# Patient Record
Sex: Male | Born: 1935 | Race: Black or African American | Hispanic: No | Marital: Married
Health system: Southern US, Community
[De-identification: ages and names within clinical notes are randomized; demographics above are authoritative.]

---

## 2003-11-06 ENCOUNTER — Other Ambulatory Visit: Payer: Self-pay

## 2004-03-15 ENCOUNTER — Ambulatory Visit: Payer: Self-pay

## 2004-04-28 ENCOUNTER — Inpatient Hospital Stay: Payer: Self-pay | Admitting: Unknown Physician Specialty

## 2006-07-15 ENCOUNTER — Other Ambulatory Visit: Payer: Self-pay

## 2006-07-15 ENCOUNTER — Ambulatory Visit: Payer: Self-pay | Admitting: Urology

## 2006-07-22 ENCOUNTER — Ambulatory Visit: Payer: Self-pay | Admitting: Urology

## 2008-04-27 ENCOUNTER — Ambulatory Visit: Payer: Self-pay | Admitting: Internal Medicine

## 2009-03-25 ENCOUNTER — Inpatient Hospital Stay: Payer: Self-pay | Admitting: Internal Medicine

## 2009-07-16 ENCOUNTER — Inpatient Hospital Stay: Payer: Self-pay | Admitting: Internal Medicine

## 2011-04-05 ENCOUNTER — Ambulatory Visit: Payer: Self-pay | Admitting: Internal Medicine

## 2011-04-11 ENCOUNTER — Inpatient Hospital Stay: Payer: Self-pay | Admitting: Student

## 2011-04-11 LAB — COMPREHENSIVE METABOLIC PANEL
Albumin: 3.7 g/dL (ref 3.4–5.0)
Anion Gap: 9 (ref 7–16)
BUN: 21 mg/dL — ABNORMAL HIGH (ref 7–18)
Bilirubin,Total: 1.2 mg/dL — ABNORMAL HIGH (ref 0.2–1.0)
Chloride: 100 mmol/L (ref 98–107)
Creatinine: 1.41 mg/dL — ABNORMAL HIGH (ref 0.60–1.30)
EGFR (African American): >60
EGFR (Non-African Amer.): 51 — ABNORMAL LOW
Osmolality: 281 (ref 275–301)
SGPT (ALT): 262 U/L — ABNORMAL HIGH
Sodium: 136 mmol/L (ref 136–145)

## 2011-04-11 LAB — URINALYSIS, COMPLETE
Blood: NEGATIVE
Ketone: NEGATIVE
Nitrite: NEGATIVE
Ph: 5 (ref 4.5–8.0)
Protein: NEGATIVE
RBC,UR: <1 /HPF (ref 0–5)
Specific Gravity: 1.01 (ref 1.003–1.030)
WBC UR: 3 /HPF (ref 0–5)

## 2011-04-11 LAB — CBC
HGB: 13.6 g/dL (ref 13.0–18.0)
MCH: 30.8 pg (ref 26.0–34.0)
Platelet: 363 10*3/uL (ref 150–440)
RBC: 4.41 10*6/uL (ref 4.40–5.90)
WBC: 13.1 10*3/uL — ABNORMAL HIGH (ref 3.8–10.6)

## 2011-04-11 LAB — LIPASE, BLOOD: Lipase: >3000 U/L (ref 73–393)

## 2011-04-11 LAB — AMYLASE: Amylase: 898 U/L — ABNORMAL HIGH (ref 25–115)

## 2011-04-12 LAB — BASIC METABOLIC PANEL
BUN: 21 mg/dL — ABNORMAL HIGH (ref 7–18)
Calcium, Total: 8.9 mg/dL (ref 8.5–10.1)
Co2: 24 mmol/L (ref 21–32)
EGFR (African American): >60
EGFR (Non-African Amer.): >60
Glucose: 127 mg/dL — ABNORMAL HIGH (ref 65–99)
Osmolality: 284 (ref 275–301)
Potassium: 4.6 mmol/L (ref 3.5–5.1)
Sodium: 140 mmol/L (ref 136–145)

## 2011-04-12 LAB — CBC WITH DIFFERENTIAL/PLATELET
Basophil #: 0.2 10*3/uL — ABNORMAL HIGH (ref 0.0–0.1)
Basophil %: 1 %
Eosinophil #: 0.1 10*3/uL (ref 0.0–0.7)
Lymphocyte %: 13.3 %
MCV: 94 fL (ref 80–100)
Monocyte #: 1.2 10*3/uL — ABNORMAL HIGH (ref 0.0–0.7)
Monocyte %: 7.5 %
Neutrophil %: 77.6 %
Platelet: 313 10*3/uL (ref 150–440)
RBC: 4.17 10*6/uL — ABNORMAL LOW (ref 4.40–5.90)
RDW: 13.1 % (ref 11.5–14.5)
WBC: 16.2 10*3/uL — ABNORMAL HIGH (ref 3.8–10.6)

## 2011-04-12 LAB — MAGNESIUM: Magnesium: 2.5 mg/dL — ABNORMAL HIGH

## 2011-04-12 LAB — HEPATIC FUNCTION PANEL A (ARMC)
Albumin: 3.3 g/dL — ABNORMAL LOW (ref 3.4–5.0)
Alkaline Phosphatase: 169 U/L — ABNORMAL HIGH (ref 50–136)
Bilirubin, Direct: 0.1 mg/dL (ref 0.00–0.20)
Bilirubin,Total: 0.7 mg/dL (ref 0.2–1.0)
SGOT(AST): 77 U/L — ABNORMAL HIGH (ref 15–37)
Total Protein: 7.1 g/dL (ref 6.4–8.2)

## 2011-04-13 LAB — CBC WITH DIFFERENTIAL/PLATELET
Basophil #: 0.1 10*3/uL (ref 0.0–0.1)
Basophil %: 0.4 %
Eosinophil #: 0.2 10*3/uL (ref 0.0–0.7)
HCT: 36 % — ABNORMAL LOW (ref 40.0–52.0)
HGB: 11.6 g/dL — ABNORMAL LOW (ref 13.0–18.0)
Lymphocyte #: 1.6 10*3/uL (ref 1.0–3.6)
Lymphocyte %: 11.6 %
MCHC: 32.3 g/dL (ref 32.0–36.0)
MCV: 95 fL (ref 80–100)
Monocyte #: 1.1 10*3/uL — ABNORMAL HIGH (ref 0.0–0.7)
Neutrophil %: 78.9 %
RDW: 12.9 % (ref 11.5–14.5)

## 2011-04-13 LAB — BASIC METABOLIC PANEL
BUN: 11 mg/dL (ref 7–18)
Calcium, Total: 8.2 mg/dL — ABNORMAL LOW (ref 8.5–10.1)
Chloride: 103 mmol/L (ref 98–107)
Co2: 27 mmol/L (ref 21–32)
Creatinine: 0.97 mg/dL (ref 0.60–1.30)
EGFR (African American): >60
EGFR (Non-African Amer.): >60
Glucose: 213 mg/dL — ABNORMAL HIGH (ref 65–99)
Osmolality: 278 (ref 275–301)
Potassium: 4 mmol/L (ref 3.5–5.1)
Sodium: 136 mmol/L (ref 136–145)

## 2011-04-13 LAB — LIPASE, BLOOD: Lipase: 732 U/L — ABNORMAL HIGH (ref 73–393)

## 2011-04-13 LAB — HEPATIC FUNCTION PANEL A (ARMC)
Albumin: 2.7 g/dL — ABNORMAL LOW (ref 3.4–5.0)
Alkaline Phosphatase: 126 U/L (ref 50–136)
Bilirubin,Total: 0.5 mg/dL (ref 0.2–1.0)
SGOT(AST): 25 U/L (ref 15–37)
SGPT (ALT): 108 U/L — ABNORMAL HIGH
Total Protein: 6.4 g/dL (ref 6.4–8.2)

## 2011-04-14 LAB — HEPATIC FUNCTION PANEL A (ARMC)
Bilirubin, Direct: 0.2 mg/dL (ref 0.00–0.20)
Bilirubin,Total: 0.5 mg/dL (ref 0.2–1.0)

## 2011-04-14 LAB — LIPASE, BLOOD: Lipase: 388 U/L (ref 73–393)

## 2011-04-15 LAB — CBC WITH DIFFERENTIAL/PLATELET
Basophil #: 0.1 10*3/uL (ref 0.0–0.1)
Eosinophil #: 0.3 10*3/uL (ref 0.0–0.7)
Eosinophil %: 4.3 %
HCT: 34 % — ABNORMAL LOW (ref 40.0–52.0)
Lymphocyte #: 1.3 10*3/uL (ref 1.0–3.6)
Lymphocyte %: 17 %
MCV: 94 fL (ref 80–100)
Monocyte #: 0.7 10*3/uL (ref 0.0–0.7)
Monocyte %: 9.1 %
Neutrophil #: 5.3 10*3/uL (ref 1.4–6.5)
RBC: 3.62 10*6/uL — ABNORMAL LOW (ref 4.40–5.90)
RDW: 12.7 % (ref 11.5–14.5)
WBC: 7.7 10*3/uL (ref 3.8–10.6)

## 2011-04-15 LAB — HEPATIC FUNCTION PANEL A (ARMC)
Albumin: 2.7 g/dL — ABNORMAL LOW (ref 3.4–5.0)
Bilirubin, Direct: <0.1 mg/dL (ref 0.00–0.20)
SGOT(AST): 17 U/L (ref 15–37)
SGPT (ALT): 56 U/L

## 2011-04-16 LAB — BASIC METABOLIC PANEL
Calcium, Total: 8.5 mg/dL (ref 8.5–10.1)
Chloride: 104 mmol/L (ref 98–107)
Co2: 27 mmol/L (ref 21–32)
Glucose: 114 mg/dL — ABNORMAL HIGH (ref 65–99)
Osmolality: 272 (ref 275–301)
Potassium: 3.5 mmol/L (ref 3.5–5.1)

## 2011-04-17 LAB — CBC WITH DIFFERENTIAL/PLATELET
Basophil %: 0.2 %
Eosinophil #: 0.3 10*3/uL (ref 0.0–0.7)
Eosinophil %: 3.2 %
HGB: 11 g/dL — ABNORMAL LOW (ref 13.0–18.0)
Lymphocyte %: 25.8 %
MCH: 30.6 pg (ref 26.0–34.0)
MCV: 94 fL (ref 80–100)
Monocyte #: 0.4 10*3/uL (ref 0.0–0.7)
Neutrophil #: 5.5 10*3/uL (ref 1.4–6.5)
Neutrophil %: 66.3 %
Platelet: 351 10*3/uL (ref 150–440)
RBC: 3.59 10*6/uL — ABNORMAL LOW (ref 4.40–5.90)
RDW: 12.6 % (ref 11.5–14.5)

## 2011-04-17 LAB — BASIC METABOLIC PANEL
BUN: 7 mg/dL (ref 7–18)
Chloride: 104 mmol/L (ref 98–107)
Co2: 26 mmol/L (ref 21–32)
Creatinine: 0.8 mg/dL (ref 0.60–1.30)
EGFR (Non-African Amer.): >60
Glucose: 91 mg/dL (ref 65–99)
Osmolality: 277 (ref 275–301)
Potassium: 4.5 mmol/L (ref 3.5–5.1)
Sodium: 140 mmol/L (ref 136–145)

## 2011-04-17 LAB — CULTURE, BLOOD (SINGLE)

## 2011-04-22 LAB — PATHOLOGY REPORT

## 2011-05-03 ENCOUNTER — Ambulatory Visit: Payer: Self-pay | Admitting: Internal Medicine

## 2011-06-17 ENCOUNTER — Ambulatory Visit: Payer: Self-pay | Admitting: Vascular Surgery

## 2011-06-17 LAB — BASIC METABOLIC PANEL
Anion Gap: 9 (ref 7–16)
Calcium, Total: 9.7 mg/dL (ref 8.5–10.1)
Co2: 26 mmol/L (ref 21–32)
EGFR (Non-African Amer.): >60
Glucose: 103 mg/dL — ABNORMAL HIGH (ref 65–99)
Osmolality: 284 (ref 275–301)
Potassium: 5.3 mmol/L — ABNORMAL HIGH (ref 3.5–5.1)
Sodium: 141 mmol/L (ref 136–145)

## 2011-07-08 ENCOUNTER — Emergency Department: Payer: Self-pay | Admitting: Emergency Medicine

## 2011-07-08 LAB — COMPREHENSIVE METABOLIC PANEL
Albumin: 4 g/dL (ref 3.4–5.0)
Bilirubin,Total: 0.4 mg/dL (ref 0.2–1.0)
Calcium, Total: 9.6 mg/dL (ref 8.5–10.1)
Chloride: 102 mmol/L (ref 98–107)
Co2: 27 mmol/L (ref 21–32)
Creatinine: 1.16 mg/dL (ref 0.60–1.30)
EGFR (African American): >60
Glucose: 152 mg/dL — ABNORMAL HIGH (ref 65–99)
Osmolality: 277 (ref 275–301)
SGPT (ALT): 16 U/L

## 2011-07-08 LAB — CBC
HGB: 12.5 g/dL — ABNORMAL LOW (ref 13.0–18.0)
MCH: 29.9 pg (ref 26.0–34.0)
MCV: 93 fL (ref 80–100)
Platelet: 393 10*3/uL (ref 150–440)
RBC: 4.18 10*6/uL — ABNORMAL LOW (ref 4.40–5.90)
RDW: 13.2 % (ref 11.5–14.5)

## 2011-07-08 LAB — TROPONIN I: Troponin-I: <0.02 ng/mL

## 2011-09-16 ENCOUNTER — Ambulatory Visit: Payer: Self-pay | Admitting: Vascular Surgery

## 2011-09-16 LAB — CREATININE, SERUM
Creatinine: 1.11 mg/dL (ref 0.60–1.30)
EGFR (African American): >60
EGFR (Non-African Amer.): >60

## 2011-09-16 LAB — BUN: BUN: 32 mg/dL — ABNORMAL HIGH (ref 7–18)

## 2011-11-18 ENCOUNTER — Ambulatory Visit: Payer: Self-pay | Admitting: Vascular Surgery

## 2011-11-18 LAB — BASIC METABOLIC PANEL
BUN: 35 mg/dL — ABNORMAL HIGH (ref 7–18)
Chloride: 106 mmol/L (ref 98–107)
Creatinine: 1.13 mg/dL (ref 0.60–1.30)
Potassium: 4.8 mmol/L (ref 3.5–5.1)
Sodium: 137 mmol/L (ref 136–145)

## 2011-11-29 ENCOUNTER — Inpatient Hospital Stay: Payer: Self-pay | Admitting: Podiatry

## 2011-11-29 LAB — BASIC METABOLIC PANEL
Anion Gap: 6 — ABNORMAL LOW (ref 7–16)
Calcium, Total: 10 mg/dL (ref 8.5–10.1)
Chloride: 108 mmol/L — ABNORMAL HIGH (ref 98–107)
Co2: 28 mmol/L (ref 21–32)
Creatinine: 1.23 mg/dL (ref 0.60–1.30)
EGFR (Non-African Amer.): 54 — ABNORMAL LOW
Osmolality: 293 (ref 275–301)

## 2011-11-29 LAB — CBC WITH DIFFERENTIAL/PLATELET
Basophil #: 0.1 10*3/uL (ref 0.0–0.1)
Eosinophil #: 0.3 10*3/uL (ref 0.0–0.7)
HCT: 36.6 % — ABNORMAL LOW (ref 40.0–52.0)
Lymphocyte #: 2.8 10*3/uL (ref 1.0–3.6)
Lymphocyte %: 21.3 %
MCHC: 32.1 g/dL (ref 32.0–36.0)
Monocyte #: 1 x10 3/mm (ref 0.2–1.0)
Monocyte %: 7.9 %
Neutrophil #: 8.9 10*3/uL — ABNORMAL HIGH (ref 1.4–6.5)
Platelet: 504 10*3/uL — ABNORMAL HIGH (ref 150–440)
RBC: 4.01 10*6/uL — ABNORMAL LOW (ref 4.40–5.90)
RDW: 12.9 % (ref 11.5–14.5)
WBC: 13.1 10*3/uL — ABNORMAL HIGH (ref 3.8–10.6)

## 2011-12-01 LAB — CBC WITH DIFFERENTIAL/PLATELET
Eosinophil #: 0.2 10*3/uL (ref 0.0–0.7)
Lymphocyte %: 16.1 %
MCH: 30.2 pg (ref 26.0–34.0)
MCHC: 33.8 g/dL (ref 32.0–36.0)
Monocyte #: 0.8 x10 3/mm (ref 0.2–1.0)
Neutrophil %: 72.2 %
Platelet: 458 10*3/uL — ABNORMAL HIGH (ref 150–440)
RBC: 3.46 10*6/uL — ABNORMAL LOW (ref 4.40–5.90)

## 2011-12-03 ENCOUNTER — Ambulatory Visit: Payer: Self-pay | Admitting: Internal Medicine

## 2011-12-03 LAB — CBC WITH DIFFERENTIAL/PLATELET
Basophil #: 0.1 10*3/uL (ref 0.0–0.1)
Eosinophil #: 0.3 10*3/uL (ref 0.0–0.7)
Eosinophil %: 2.2 %
HGB: 10.1 g/dL — ABNORMAL LOW (ref 13.0–18.0)
Lymphocyte %: 26 %
MCH: 29.9 pg (ref 26.0–34.0)
MCV: 90 fL (ref 80–100)
Monocyte #: 1.3 x10 3/mm — ABNORMAL HIGH (ref 0.2–1.0)
Monocyte %: 9.8 %
Platelet: 467 10*3/uL — ABNORMAL HIGH (ref 150–440)
RBC: 3.39 10*6/uL — ABNORMAL LOW (ref 4.40–5.90)
RDW: 12.8 % (ref 11.5–14.5)
WBC: 13.4 10*3/uL — ABNORMAL HIGH (ref 3.8–10.6)

## 2011-12-09 ENCOUNTER — Other Ambulatory Visit: Payer: Self-pay | Admitting: Podiatry

## 2011-12-13 LAB — WOUND CULTURE

## 2011-12-26 ENCOUNTER — Inpatient Hospital Stay: Payer: Self-pay | Admitting: Internal Medicine

## 2011-12-26 LAB — CBC WITH DIFFERENTIAL/PLATELET
Basophil %: 0.5 %
Eosinophil #: 0.3 10*3/uL (ref 0.0–0.7)
Lymphocyte %: 15.3 %
MCH: 30.1 pg (ref 26.0–34.0)
MCHC: 32.4 g/dL (ref 32.0–36.0)
Monocyte #: 0.8 x10 3/mm (ref 0.2–1.0)
Neutrophil #: 11.3 10*3/uL — ABNORMAL HIGH (ref 1.4–6.5)
Neutrophil %: 76.5 %
Platelet: 489 10*3/uL — ABNORMAL HIGH (ref 150–440)
RDW: 14.2 % (ref 11.5–14.5)

## 2011-12-26 LAB — URINALYSIS, COMPLETE
Bacteria: NONE SEEN
Bilirubin,UR: NEGATIVE
Blood: NEGATIVE
Hyaline Cast: 3
Ketone: NEGATIVE
Ph: 5 (ref 4.5–8.0)
Protein: NEGATIVE
Specific Gravity: 1.014 (ref 1.003–1.030)
Squamous Epithelial: NONE SEEN
WBC UR: 1 /HPF (ref 0–5)

## 2011-12-26 LAB — COMPREHENSIVE METABOLIC PANEL
Alkaline Phosphatase: 106 U/L (ref 50–136)
Anion Gap: 10 (ref 7–16)
Bilirubin,Total: 0.4 mg/dL (ref 0.2–1.0)
Chloride: 110 mmol/L — ABNORMAL HIGH (ref 98–107)
Co2: 18 mmol/L — ABNORMAL LOW (ref 21–32)
Creatinine: 2.81 mg/dL — ABNORMAL HIGH (ref 0.60–1.30)
EGFR (African American): 23 — ABNORMAL LOW
EGFR (Non-African Amer.): 20 — ABNORMAL LOW
Osmolality: 298 (ref 275–301)
Potassium: 6.2 mmol/L — ABNORMAL HIGH (ref 3.5–5.1)
SGPT (ALT): 30 U/L (ref 12–78)
Sodium: 138 mmol/L (ref 136–145)

## 2011-12-26 LAB — BASIC METABOLIC PANEL
Anion Gap: 8 (ref 7–16)
BUN: 61 mg/dL — ABNORMAL HIGH (ref 7–18)
Chloride: 110 mmol/L — ABNORMAL HIGH (ref 98–107)
Co2: 19 mmol/L — ABNORMAL LOW (ref 21–32)
Creatinine: 2.21 mg/dL — ABNORMAL HIGH (ref 0.60–1.30)
EGFR (African American): 31 — ABNORMAL LOW
Potassium: 5.9 mmol/L — ABNORMAL HIGH (ref 3.5–5.1)

## 2011-12-26 LAB — POTASSIUM: Potassium: 5.7 mmol/L — ABNORMAL HIGH (ref 3.5–5.1)

## 2011-12-27 LAB — IRON AND TIBC
Iron Bind.Cap.(Total): 234 ug/dL — ABNORMAL LOW (ref 250–450)
Iron: 75 ug/dL (ref 65–175)
Unbound Iron-Bind.Cap.: 159 ug/dL

## 2011-12-27 LAB — FERRITIN: Ferritin (ARMC): 256 ng/mL (ref 8–388)

## 2011-12-27 LAB — CBC WITH DIFFERENTIAL/PLATELET
Basophil #: 0.1 10*3/uL (ref 0.0–0.1)
Basophil %: 0.4 %
Eosinophil #: 0.4 10*3/uL (ref 0.0–0.7)
HCT: 26.4 % — ABNORMAL LOW (ref 40.0–52.0)
Lymphocyte #: 2 10*3/uL (ref 1.0–3.6)
Lymphocyte %: 15.9 %
MCH: 29.6 pg (ref 26.0–34.0)
MCHC: 31.8 g/dL — ABNORMAL LOW (ref 32.0–36.0)
MCV: 93 fL (ref 80–100)
Monocyte #: 0.7 x10 3/mm (ref 0.2–1.0)
Neutrophil #: 9.2 10*3/uL — ABNORMAL HIGH (ref 1.4–6.5)
Platelet: 484 10*3/uL — ABNORMAL HIGH (ref 150–440)
RDW: 14.3 % (ref 11.5–14.5)
WBC: 12.3 10*3/uL — ABNORMAL HIGH (ref 3.8–10.6)

## 2011-12-27 LAB — BASIC METABOLIC PANEL
BUN: 47 mg/dL — ABNORMAL HIGH (ref 7–18)
Creatinine: 1.68 mg/dL — ABNORMAL HIGH (ref 0.60–1.30)

## 2011-12-28 LAB — CBC WITH DIFFERENTIAL/PLATELET
Basophil #: 0 10*3/uL (ref 0.0–0.1)
Basophil %: 0.5 %
Eosinophil #: 0.5 10*3/uL (ref 0.0–0.7)
HCT: 25 % — ABNORMAL LOW (ref 40.0–52.0)
HGB: 8 g/dL — ABNORMAL LOW (ref 13.0–18.0)
Lymphocyte %: 21.3 %
MCHC: 32.1 g/dL (ref 32.0–36.0)
Monocyte %: 7.5 %
Neutrophil #: 5.8 10*3/uL (ref 1.4–6.5)
Neutrophil %: 64.6 %
Platelet: 372 10*3/uL (ref 150–440)
RDW: 14.6 % — ABNORMAL HIGH (ref 11.5–14.5)
WBC: 9 10*3/uL (ref 3.8–10.6)

## 2011-12-28 LAB — COMPREHENSIVE METABOLIC PANEL
Albumin: 2.7 g/dL — ABNORMAL LOW (ref 3.4–5.0)
Alkaline Phosphatase: 96 U/L (ref 50–136)
Anion Gap: 10 (ref 7–16)
BUN: 17 mg/dL (ref 7–18)
Calcium, Total: 7.9 mg/dL — ABNORMAL LOW (ref 8.5–10.1)
Chloride: 116 mmol/L — ABNORMAL HIGH (ref 98–107)
Creatinine: 1.06 mg/dL (ref 0.60–1.30)
EGFR (Non-African Amer.): >60
Glucose: 94 mg/dL (ref 65–99)
Osmolality: 288 (ref 275–301)
Potassium: 3.7 mmol/L (ref 3.5–5.1)
SGOT(AST): 28 U/L (ref 15–37)
Total Protein: 6.5 g/dL (ref 6.4–8.2)

## 2011-12-29 LAB — HEMOGLOBIN: HGB: 8.3 g/dL — ABNORMAL LOW (ref 13.0–18.0)

## 2011-12-29 LAB — WBCS, STOOL

## 2011-12-30 LAB — CBC WITH DIFFERENTIAL/PLATELET
Basophil %: 0.6 %
Eosinophil %: 3.9 %
HCT: 29.4 % — ABNORMAL LOW (ref 40.0–52.0)
Lymphocyte %: 29.5 %
MCH: 30.8 pg (ref 26.0–34.0)
MCV: 93 fL (ref 80–100)
Monocyte %: 8.9 %
Neutrophil %: 57.1 %
Platelet: 388 10*3/uL (ref 150–440)
RDW: 14.5 % (ref 11.5–14.5)
WBC: 8.3 10*3/uL (ref 3.8–10.6)

## 2011-12-30 LAB — PSA: PSA: 0.3 ng/mL (ref 0.0–4.0)

## 2011-12-30 LAB — KAPPA/LAMBDA FREE LIGHT CHAINS (ARMC)

## 2012-01-03 ENCOUNTER — Ambulatory Visit: Payer: Self-pay | Admitting: Internal Medicine

## 2012-01-14 ENCOUNTER — Inpatient Hospital Stay: Payer: Self-pay | Admitting: Vascular Surgery

## 2012-01-14 LAB — BASIC METABOLIC PANEL
Anion Gap: 6 — ABNORMAL LOW (ref 7–16)
BUN: 68 mg/dL — ABNORMAL HIGH (ref 7–18)
Chloride: 110 mmol/L — ABNORMAL HIGH (ref 98–107)
Co2: 22 mmol/L (ref 21–32)
Creatinine: 3.3 mg/dL — ABNORMAL HIGH (ref 0.60–1.30)
EGFR (African American): 19 — ABNORMAL LOW
Glucose: 39 mg/dL — CL (ref 65–99)
Sodium: 138 mmol/L (ref 136–145)

## 2012-01-14 LAB — CBC WITH DIFFERENTIAL/PLATELET
Basophil #: 0.1 10*3/uL (ref 0.0–0.1)
Basophil %: 0.8 %
Eosinophil %: 3.7 %
HCT: 26.6 % — ABNORMAL LOW (ref 40.0–52.0)
HGB: 8.8 g/dL — ABNORMAL LOW (ref 13.0–18.0)
Lymphocyte #: 1.1 10*3/uL (ref 1.0–3.6)
Lymphocyte %: 13.7 %
MCHC: 33 g/dL (ref 32.0–36.0)
MCV: 93 fL (ref 80–100)
Monocyte %: 7.2 %
Neutrophil %: 74.6 %
Platelet: 485 10*3/uL — ABNORMAL HIGH (ref 150–440)
RDW: 15.3 % — ABNORMAL HIGH (ref 11.5–14.5)
WBC: 7.8 10*3/uL (ref 3.8–10.6)

## 2012-01-15 LAB — BASIC METABOLIC PANEL
Calcium, Total: 8.6 mg/dL (ref 8.5–10.1)
Chloride: 107 mmol/L (ref 98–107)
Co2: 22 mmol/L (ref 21–32)
EGFR (Non-African Amer.): 36 — ABNORMAL LOW
Osmolality: 284 (ref 275–301)
Potassium: 6.6 mmol/L (ref 3.5–5.1)
Sodium: 137 mmol/L (ref 136–145)

## 2012-01-15 LAB — CBC WITH DIFFERENTIAL/PLATELET
Basophil #: 0.1 10*3/uL (ref 0.0–0.1)
Eosinophil #: 0.2 10*3/uL (ref 0.0–0.7)
Lymphocyte #: 2.1 10*3/uL (ref 1.0–3.6)
MCH: 30.6 pg (ref 26.0–34.0)
MCHC: 33.1 g/dL (ref 32.0–36.0)
MCV: 92 fL (ref 80–100)
Monocyte #: 0.7 x10 3/mm (ref 0.2–1.0)
Neutrophil %: 57.3 %
Platelet: 483 10*3/uL — ABNORMAL HIGH (ref 150–440)
RDW: 14.7 % — ABNORMAL HIGH (ref 11.5–14.5)
WBC: 7.3 10*3/uL (ref 3.8–10.6)

## 2012-01-15 LAB — POTASSIUM: Potassium: 5.6 mmol/L — ABNORMAL HIGH (ref 3.5–5.1)

## 2012-01-16 LAB — BASIC METABOLIC PANEL
Anion Gap: 8 (ref 7–16)
BUN: 25 mg/dL — ABNORMAL HIGH (ref 7–18)
Chloride: 111 mmol/L — ABNORMAL HIGH (ref 98–107)
Co2: 20 mmol/L — ABNORMAL LOW (ref 21–32)
Glucose: 91 mg/dL (ref 65–99)
Potassium: 5 mmol/L (ref 3.5–5.1)
Sodium: 139 mmol/L (ref 136–145)

## 2012-01-17 LAB — BASIC METABOLIC PANEL
Anion Gap: 7 (ref 7–16)
BUN: 20 mg/dL — ABNORMAL HIGH (ref 7–18)
Chloride: 110 mmol/L — ABNORMAL HIGH (ref 98–107)
Co2: 20 mmol/L — ABNORMAL LOW (ref 21–32)
Glucose: 79 mg/dL (ref 65–99)
Osmolality: 275 (ref 275–301)

## 2012-01-18 LAB — CBC WITH DIFFERENTIAL/PLATELET
Basophil #: 0.1 10*3/uL (ref 0.0–0.1)
Basophil %: 0.4 %
Eosinophil %: 0.3 %
HCT: 24.5 % — ABNORMAL LOW (ref 40.0–52.0)
HGB: 8 g/dL — ABNORMAL LOW (ref 13.0–18.0)
Lymphocyte #: 2.8 10*3/uL (ref 1.0–3.6)
Lymphocyte %: 18.9 %
MCV: 93 fL (ref 80–100)
Monocyte #: 1.5 x10 3/mm — ABNORMAL HIGH (ref 0.2–1.0)
Monocyte %: 10.3 %
Platelet: 419 10*3/uL (ref 150–440)
RBC: 2.64 10*6/uL — ABNORMAL LOW (ref 4.40–5.90)
RDW: 14.6 % — ABNORMAL HIGH (ref 11.5–14.5)
WBC: 15 10*3/uL — ABNORMAL HIGH (ref 3.8–10.6)

## 2012-01-20 LAB — CBC WITH DIFFERENTIAL/PLATELET
Basophil #: 0.1 10*3/uL (ref 0.0–0.1)
Eosinophil #: 0.2 10*3/uL (ref 0.0–0.7)
HCT: 27.6 % — ABNORMAL LOW (ref 40.0–52.0)
HGB: 8.8 g/dL — ABNORMAL LOW (ref 13.0–18.0)
Lymphocyte #: 1.4 10*3/uL (ref 1.0–3.6)
Lymphocyte %: 11.1 %
MCH: 29.4 pg (ref 26.0–34.0)
MCV: 92 fL (ref 80–100)
Monocyte %: 6.5 %
Platelet: 476 10*3/uL — ABNORMAL HIGH (ref 150–440)
RBC: 3 10*6/uL — ABNORMAL LOW (ref 4.40–5.90)
RDW: 14.7 % — ABNORMAL HIGH (ref 11.5–14.5)
WBC: 12.8 10*3/uL — ABNORMAL HIGH (ref 3.8–10.6)

## 2012-01-22 LAB — PATHOLOGY REPORT

## 2012-03-10 ENCOUNTER — Ambulatory Visit: Payer: Self-pay | Admitting: Vascular Surgery

## 2012-03-12 ENCOUNTER — Ambulatory Visit: Payer: Self-pay | Admitting: Vascular Surgery

## 2012-03-16 LAB — WOUND CULTURE

## 2012-05-04 ENCOUNTER — Ambulatory Visit: Payer: Self-pay | Admitting: Vascular Surgery

## 2012-05-04 LAB — BASIC METABOLIC PANEL
Anion Gap: 6 — ABNORMAL LOW (ref 7–16)
Chloride: 104 mmol/L (ref 98–107)
Co2: 27 mmol/L (ref 21–32)
EGFR (African American): >60
EGFR (Non-African Amer.): >60
Glucose: 81 mg/dL (ref 65–99)
Osmolality: 273 (ref 275–301)

## 2012-06-22 ENCOUNTER — Ambulatory Visit: Payer: Self-pay | Admitting: Vascular Surgery

## 2012-06-22 LAB — CBC
HCT: 33.2 % — ABNORMAL LOW (ref 40.0–52.0)
HGB: 10.6 g/dL — ABNORMAL LOW (ref 13.0–18.0)
MCH: 27.6 pg (ref 26.0–34.0)
MCHC: 31.8 g/dL — ABNORMAL LOW (ref 32.0–36.0)
MCV: 87 fL (ref 80–100)
RBC: 3.84 10*6/uL — ABNORMAL LOW (ref 4.40–5.90)
RDW: 13.6 % (ref 11.5–14.5)
WBC: 12.3 10*3/uL — ABNORMAL HIGH (ref 3.8–10.6)

## 2012-06-22 LAB — BASIC METABOLIC PANEL
Calcium, Total: 9.6 mg/dL (ref 8.5–10.1)
Chloride: 104 mmol/L (ref 98–107)
Co2: 30 mmol/L (ref 21–32)
Creatinine: 0.86 mg/dL (ref 0.60–1.30)
EGFR (Non-African Amer.): >60
Osmolality: 278 (ref 275–301)
Sodium: 137 mmol/L (ref 136–145)

## 2012-06-25 ENCOUNTER — Ambulatory Visit: Payer: Self-pay | Admitting: Vascular Surgery

## 2012-07-16 ENCOUNTER — Ambulatory Visit: Payer: Self-pay | Admitting: Vascular Surgery

## 2012-07-20 LAB — PATHOLOGY REPORT

## 2012-07-23 ENCOUNTER — Ambulatory Visit: Payer: Self-pay | Admitting: Vascular Surgery

## 2012-07-23 LAB — BASIC METABOLIC PANEL
Chloride: 104 mmol/L (ref 98–107)
Creatinine: 0.9 mg/dL (ref 0.60–1.30)
EGFR (African American): >60
EGFR (Non-African Amer.): >60
Glucose: 96 mg/dL (ref 65–99)
Sodium: 138 mmol/L (ref 136–145)

## 2012-07-29 ENCOUNTER — Ambulatory Visit: Payer: Self-pay | Admitting: Vascular Surgery

## 2012-07-30 ENCOUNTER — Inpatient Hospital Stay: Payer: Self-pay | Admitting: Internal Medicine

## 2012-07-30 LAB — URINALYSIS, COMPLETE
Bacteria: NONE SEEN
Bilirubin,UR: NEGATIVE
Blood: NEGATIVE
Glucose,UR: 50 mg/dL (ref 0–75)
Hyaline Cast: 3
Nitrite: NEGATIVE
Ph: 5 (ref 4.5–8.0)
Protein: NEGATIVE
RBC,UR: <1 /HPF (ref 0–5)
Specific Gravity: 1.018 (ref 1.003–1.030)
Squamous Epithelial: <1
WBC UR: 1 /HPF (ref 0–5)

## 2012-07-30 LAB — COMPREHENSIVE METABOLIC PANEL
Albumin: 3 g/dL — ABNORMAL LOW (ref 3.4–5.0)
BUN: 14 mg/dL (ref 7–18)
Bilirubin,Total: 0.4 mg/dL (ref 0.2–1.0)
Chloride: 104 mmol/L (ref 98–107)
Co2: 28 mmol/L (ref 21–32)
EGFR (African American): >60
EGFR (Non-African Amer.): >60
Glucose: 155 mg/dL — ABNORMAL HIGH (ref 65–99)
Potassium: 4.4 mmol/L (ref 3.5–5.1)
SGOT(AST): 20 U/L (ref 15–37)
Sodium: 139 mmol/L (ref 136–145)
Total Protein: 8.7 g/dL — ABNORMAL HIGH (ref 6.4–8.2)

## 2012-07-30 LAB — CBC
HCT: 27.7 % — ABNORMAL LOW (ref 40.0–52.0)
MCHC: 31.8 g/dL — ABNORMAL LOW (ref 32.0–36.0)
MCV: 83 fL (ref 80–100)
Platelet: 550 10*3/uL — ABNORMAL HIGH (ref 150–440)
RBC: 3.35 10*6/uL — ABNORMAL LOW (ref 4.40–5.90)
WBC: 17.3 10*3/uL — ABNORMAL HIGH (ref 3.8–10.6)

## 2012-07-31 LAB — CBC WITH DIFFERENTIAL/PLATELET
Basophil %: 0.5 %
Eosinophil #: 0.1 10*3/uL (ref 0.0–0.7)
HCT: 25.8 % — ABNORMAL LOW (ref 40.0–52.0)
Lymphocyte #: 1.8 10*3/uL (ref 1.0–3.6)
MCV: 82 fL (ref 80–100)
Monocyte #: 1.3 x10 3/mm — ABNORMAL HIGH (ref 0.2–1.0)
Neutrophil %: 78.4 %
RBC: 3.17 10*6/uL — ABNORMAL LOW (ref 4.40–5.90)
RDW: 14.9 % — ABNORMAL HIGH (ref 11.5–14.5)
WBC: 15 10*3/uL — ABNORMAL HIGH (ref 3.8–10.6)

## 2012-07-31 LAB — BASIC METABOLIC PANEL
Calcium, Total: 8.8 mg/dL (ref 8.5–10.1)
Chloride: 104 mmol/L (ref 98–107)
Co2: 26 mmol/L (ref 21–32)
EGFR (African American): >60
EGFR (Non-African Amer.): >60
Osmolality: 273 (ref 275–301)

## 2012-07-31 LAB — PATHOLOGY REPORT

## 2012-08-01 LAB — CBC WITH DIFFERENTIAL/PLATELET
Basophil %: 0.5 %
Eosinophil #: 0.3 10*3/uL (ref 0.0–0.7)
HCT: 23.9 % — ABNORMAL LOW (ref 40.0–52.0)
Lymphocyte #: 1.9 10*3/uL (ref 1.0–3.6)
Lymphocyte %: 14.5 %
MCH: 27 pg (ref 26.0–34.0)
MCHC: 32.8 g/dL (ref 32.0–36.0)
MCV: 82 fL (ref 80–100)
Monocyte #: 0.9 x10 3/mm (ref 0.2–1.0)
Neutrophil #: 9.7 10*3/uL — ABNORMAL HIGH (ref 1.4–6.5)
Neutrophil %: 75.9 %
Platelet: 394 10*3/uL (ref 150–440)
RBC: 2.92 10*6/uL — ABNORMAL LOW (ref 4.40–5.90)
RDW: 14.8 % — ABNORMAL HIGH (ref 11.5–14.5)
WBC: 12.8 10*3/uL — ABNORMAL HIGH (ref 3.8–10.6)

## 2012-08-03 LAB — CULTURE, BLOOD (SINGLE)

## 2012-08-04 LAB — CREATININE, SERUM
Creatinine: 0.59 mg/dL — ABNORMAL LOW (ref 0.60–1.30)
EGFR (African American): >60

## 2012-08-04 LAB — CULTURE, BLOOD (SINGLE)

## 2012-08-05 LAB — CBC WITH DIFFERENTIAL/PLATELET
Basophil %: 0.6 %
Eosinophil %: 2.5 %
HGB: 8.3 g/dL — ABNORMAL LOW (ref 13.0–18.0)
Lymphocyte %: 17.5 %
MCH: 26.7 pg (ref 26.0–34.0)
MCV: 83 fL (ref 80–100)
Monocyte #: 0.8 x10 3/mm (ref 0.2–1.0)
Monocyte %: 6.4 %
Neutrophil #: 9.3 10*3/uL — ABNORMAL HIGH (ref 1.4–6.5)
Neutrophil %: 73 %
Platelet: 516 10*3/uL — ABNORMAL HIGH (ref 150–440)

## 2012-08-06 LAB — BASIC METABOLIC PANEL
Anion Gap: 6 — ABNORMAL LOW (ref 7–16)
BUN: 10 mg/dL (ref 7–18)
Calcium, Total: 9.6 mg/dL (ref 8.5–10.1)
Chloride: 103 mmol/L (ref 98–107)
Creatinine: 0.76 mg/dL (ref 0.60–1.30)
EGFR (African American): >60
EGFR (Non-African Amer.): >60
Osmolality: 276 (ref 275–301)
Potassium: 4.3 mmol/L (ref 3.5–5.1)

## 2012-08-06 LAB — CBC WITH DIFFERENTIAL/PLATELET
Basophil %: 1 %
Eosinophil #: 0.4 10*3/uL (ref 0.0–0.7)
Eosinophil %: 3.2 %
HCT: 26 % — ABNORMAL LOW (ref 40.0–52.0)
HGB: 8.4 g/dL — ABNORMAL LOW (ref 13.0–18.0)
Lymphocyte %: 22.1 %
MCHC: 32.5 g/dL (ref 32.0–36.0)
MCV: 82 fL (ref 80–100)
Monocyte #: 0.8 x10 3/mm (ref 0.2–1.0)
Neutrophil #: 8.8 10*3/uL — ABNORMAL HIGH (ref 1.4–6.5)
Platelet: 579 10*3/uL — ABNORMAL HIGH (ref 150–440)
WBC: 13.1 10*3/uL — ABNORMAL HIGH (ref 3.8–10.6)

## 2012-08-07 LAB — CBC WITH DIFFERENTIAL/PLATELET
Basophil %: 0.4 %
Eosinophil %: 2 %
HCT: 29.4 % — ABNORMAL LOW (ref 40.0–52.0)
Lymphocyte %: 14.4 %
MCH: 26.7 pg (ref 26.0–34.0)
MCV: 84 fL (ref 80–100)
Monocyte %: 7.6 %
Neutrophil #: 11.2 10*3/uL — ABNORMAL HIGH (ref 1.4–6.5)
Neutrophil %: 75.6 %
Platelet: 607 10*3/uL — ABNORMAL HIGH (ref 150–440)
RBC: 3.5 10*6/uL — ABNORMAL LOW (ref 4.40–5.90)
RDW: 16.1 % — ABNORMAL HIGH (ref 11.5–14.5)
WBC: 14.8 10*3/uL — ABNORMAL HIGH (ref 3.8–10.6)

## 2012-08-07 LAB — VANCOMYCIN, TROUGH: Vancomycin, Trough: 18 ug/mL (ref 10–20)

## 2012-08-07 LAB — CULTURE, BLOOD (SINGLE)

## 2012-08-08 LAB — BASIC METABOLIC PANEL
BUN: 21 mg/dL — ABNORMAL HIGH (ref 7–18)
Chloride: 103 mmol/L (ref 98–107)
Co2: 25 mmol/L (ref 21–32)
EGFR (African American): >60
EGFR (Non-African Amer.): 55 — ABNORMAL LOW
Sodium: 136 mmol/L (ref 136–145)

## 2012-08-08 LAB — CBC WITH DIFFERENTIAL/PLATELET
Basophil #: 0.1 10*3/uL
Basophil %: 0.7 %
Eosinophil #: 0.5 10*3/uL
Eosinophil %: 3.7 %
HCT: 26.2 % — ABNORMAL LOW
HGB: 8.6 g/dL — ABNORMAL LOW
Lymphocyte %: 23.3 %
Lymphs Abs: 3 10*3/uL
MCH: 27.3 pg
MCHC: 32.7 g/dL
MCV: 84 fL
Monocyte #: 1.3 10*3/uL — ABNORMAL HIGH
Monocyte %: 10.3 %
Neutrophil #: 8.1 10*3/uL — ABNORMAL HIGH
Neutrophil %: 62 %
Platelet: 529 10*3/uL — ABNORMAL HIGH
RBC: 3.13 x10 6/mm 3 — ABNORMAL LOW
RDW: 16.3 % — ABNORMAL HIGH
WBC: 13.1 10*3/uL — ABNORMAL HIGH

## 2012-08-09 LAB — BASIC METABOLIC PANEL
BUN: 20 mg/dL — ABNORMAL HIGH (ref 7–18)
Co2: 29 mmol/L (ref 21–32)
Creatinine: 1.32 mg/dL — ABNORMAL HIGH (ref 0.60–1.30)
Osmolality: 280 (ref 275–301)
Potassium: 4.6 mmol/L (ref 3.5–5.1)
Sodium: 137 mmol/L (ref 136–145)

## 2012-08-09 LAB — CBC WITH DIFFERENTIAL/PLATELET
Basophil %: 0.8 %
Eosinophil #: 0.4 10*3/uL (ref 0.0–0.7)
Eosinophil %: 3.8 %
HGB: 8.1 g/dL — ABNORMAL LOW (ref 13.0–18.0)
Lymphocyte #: 2.7 10*3/uL (ref 1.0–3.6)
MCHC: 31.8 g/dL — ABNORMAL LOW (ref 32.0–36.0)
Monocyte #: 0.9 x10 3/mm (ref 0.2–1.0)
Monocyte %: 8.1 %
Neutrophil #: 7.4 10*3/uL — ABNORMAL HIGH (ref 1.4–6.5)
Neutrophil %: 64.1 %
Platelet: 552 10*3/uL — ABNORMAL HIGH (ref 150–440)
RBC: 3.05 10*6/uL — ABNORMAL LOW (ref 4.40–5.90)

## 2012-08-10 LAB — CREATININE, SERUM
EGFR (African American): 48 — ABNORMAL LOW
EGFR (Non-African Amer.): 41 — ABNORMAL LOW

## 2012-08-11 LAB — CREATININE, SERUM: EGFR (African American): 49 — ABNORMAL LOW

## 2012-08-11 LAB — VANCOMYCIN, TROUGH: Vancomycin, Trough: 34 ug/mL (ref 10–20)

## 2012-08-15 ENCOUNTER — Other Ambulatory Visit: Payer: Self-pay | Admitting: Specialist

## 2012-08-15 LAB — COMPREHENSIVE METABOLIC PANEL
Albumin: 2.9 g/dL — ABNORMAL LOW (ref 3.4–5.0)
Alkaline Phosphatase: 133 U/L (ref 50–136)
Anion Gap: 5 — ABNORMAL LOW (ref 7–16)
BUN: 31 mg/dL — ABNORMAL HIGH (ref 7–18)
Bilirubin,Total: 0.4 mg/dL (ref 0.2–1.0)
Calcium, Total: 9.3 mg/dL (ref 8.5–10.1)
Co2: 32 mmol/L (ref 21–32)
EGFR (African American): 46 — ABNORMAL LOW
EGFR (Non-African Amer.): 39 — ABNORMAL LOW
Glucose: 78 mg/dL (ref 65–99)
Potassium: 4.7 mmol/L (ref 3.5–5.1)
SGPT (ALT): 21 U/L (ref 12–78)
Sodium: 142 mmol/L (ref 136–145)

## 2012-08-15 LAB — CBC WITH DIFFERENTIAL/PLATELET
Basophil %: 0.2 %
HGB: 7.8 g/dL — ABNORMAL LOW (ref 13.0–18.0)
Lymphocyte #: 2.4 10*3/uL (ref 1.0–3.6)
Lymphocyte %: 24.4 %
MCV: 85 fL (ref 80–100)
Monocyte #: 0.6 x10 3/mm (ref 0.2–1.0)
Monocyte %: 6.3 %
Neutrophil #: 6.1 10*3/uL (ref 1.4–6.5)
Platelet: 476 10*3/uL — ABNORMAL HIGH (ref 150–440)
RBC: 2.89 10*6/uL — ABNORMAL LOW (ref 4.40–5.90)
RDW: 17.4 % — ABNORMAL HIGH (ref 11.5–14.5)
WBC: 9.7 10*3/uL (ref 3.8–10.6)

## 2013-01-17 IMAGING — CR DG ABDOMEN 1V
1 series · 2 of 2 positions shown · non-contrast
Comparison: none

REASON FOR EXAM: follow up of ileus
COMMENTS:

[Series 1: ap · 0.17mm/px · 2 of 2 slices shown]
[im 1/2]
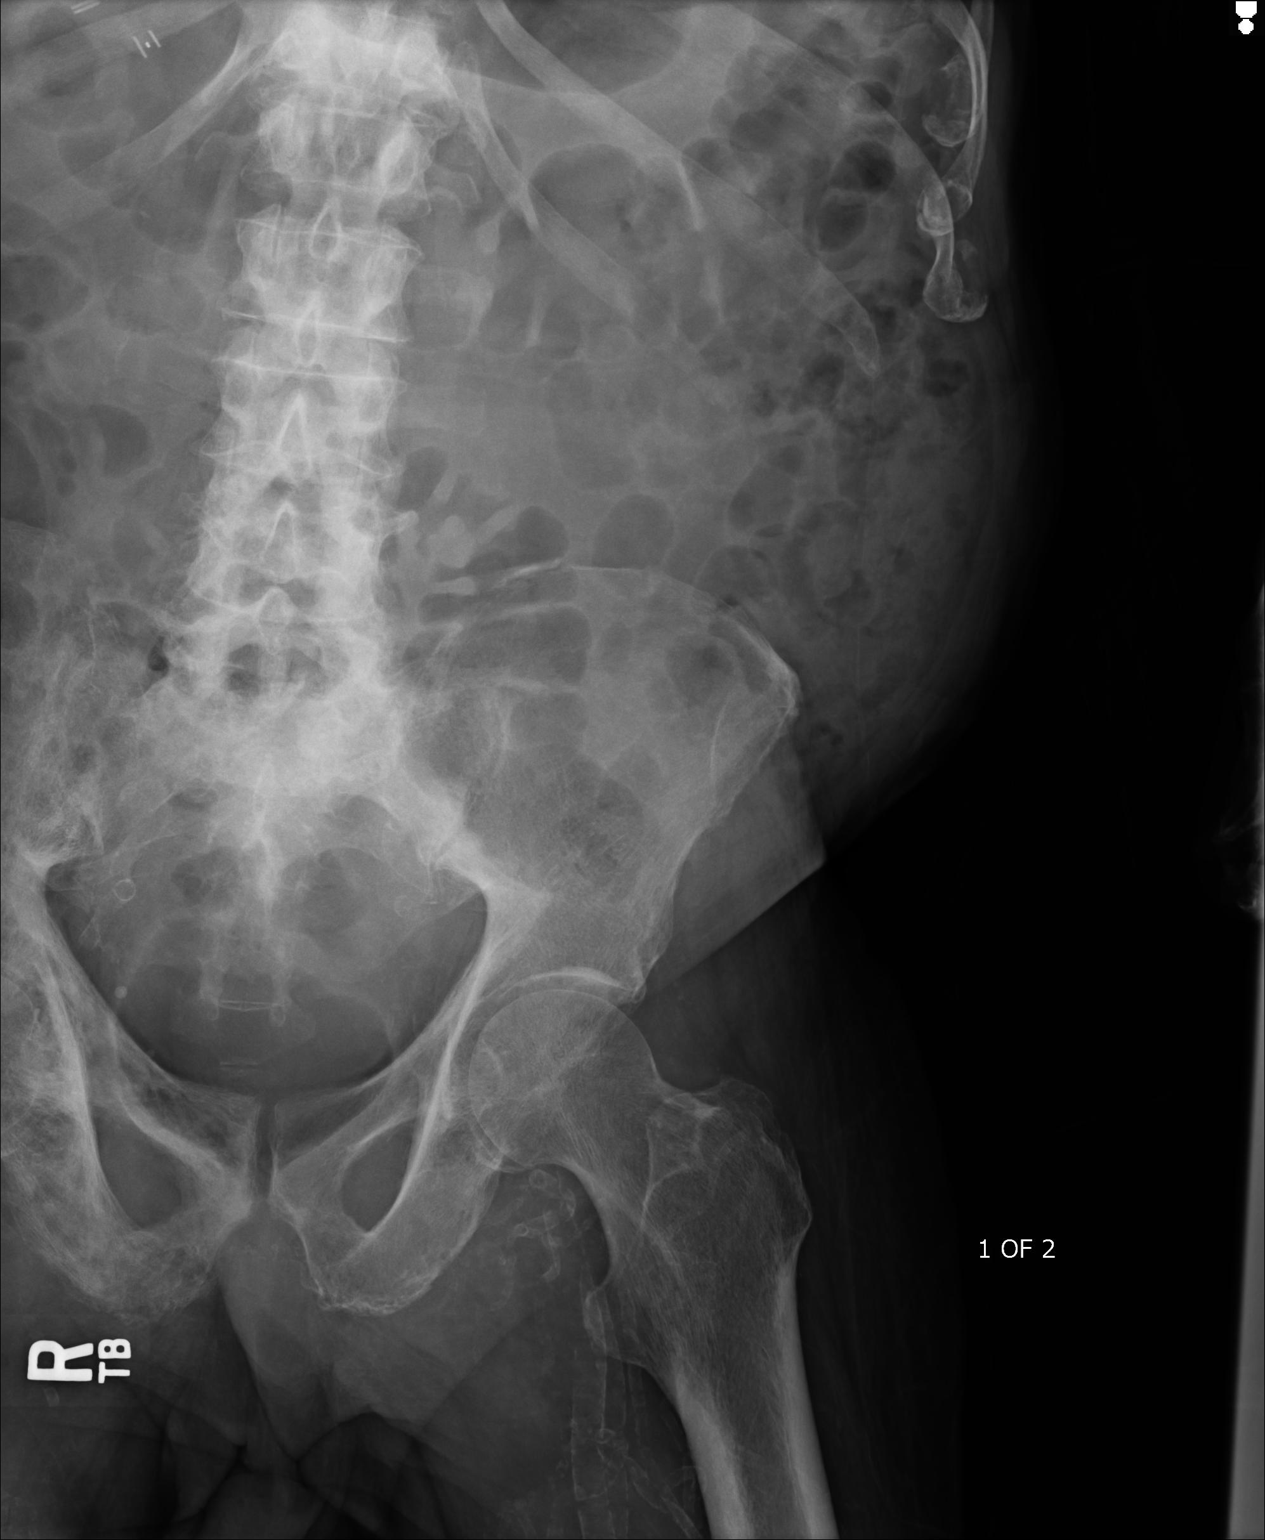
[im 2/2]
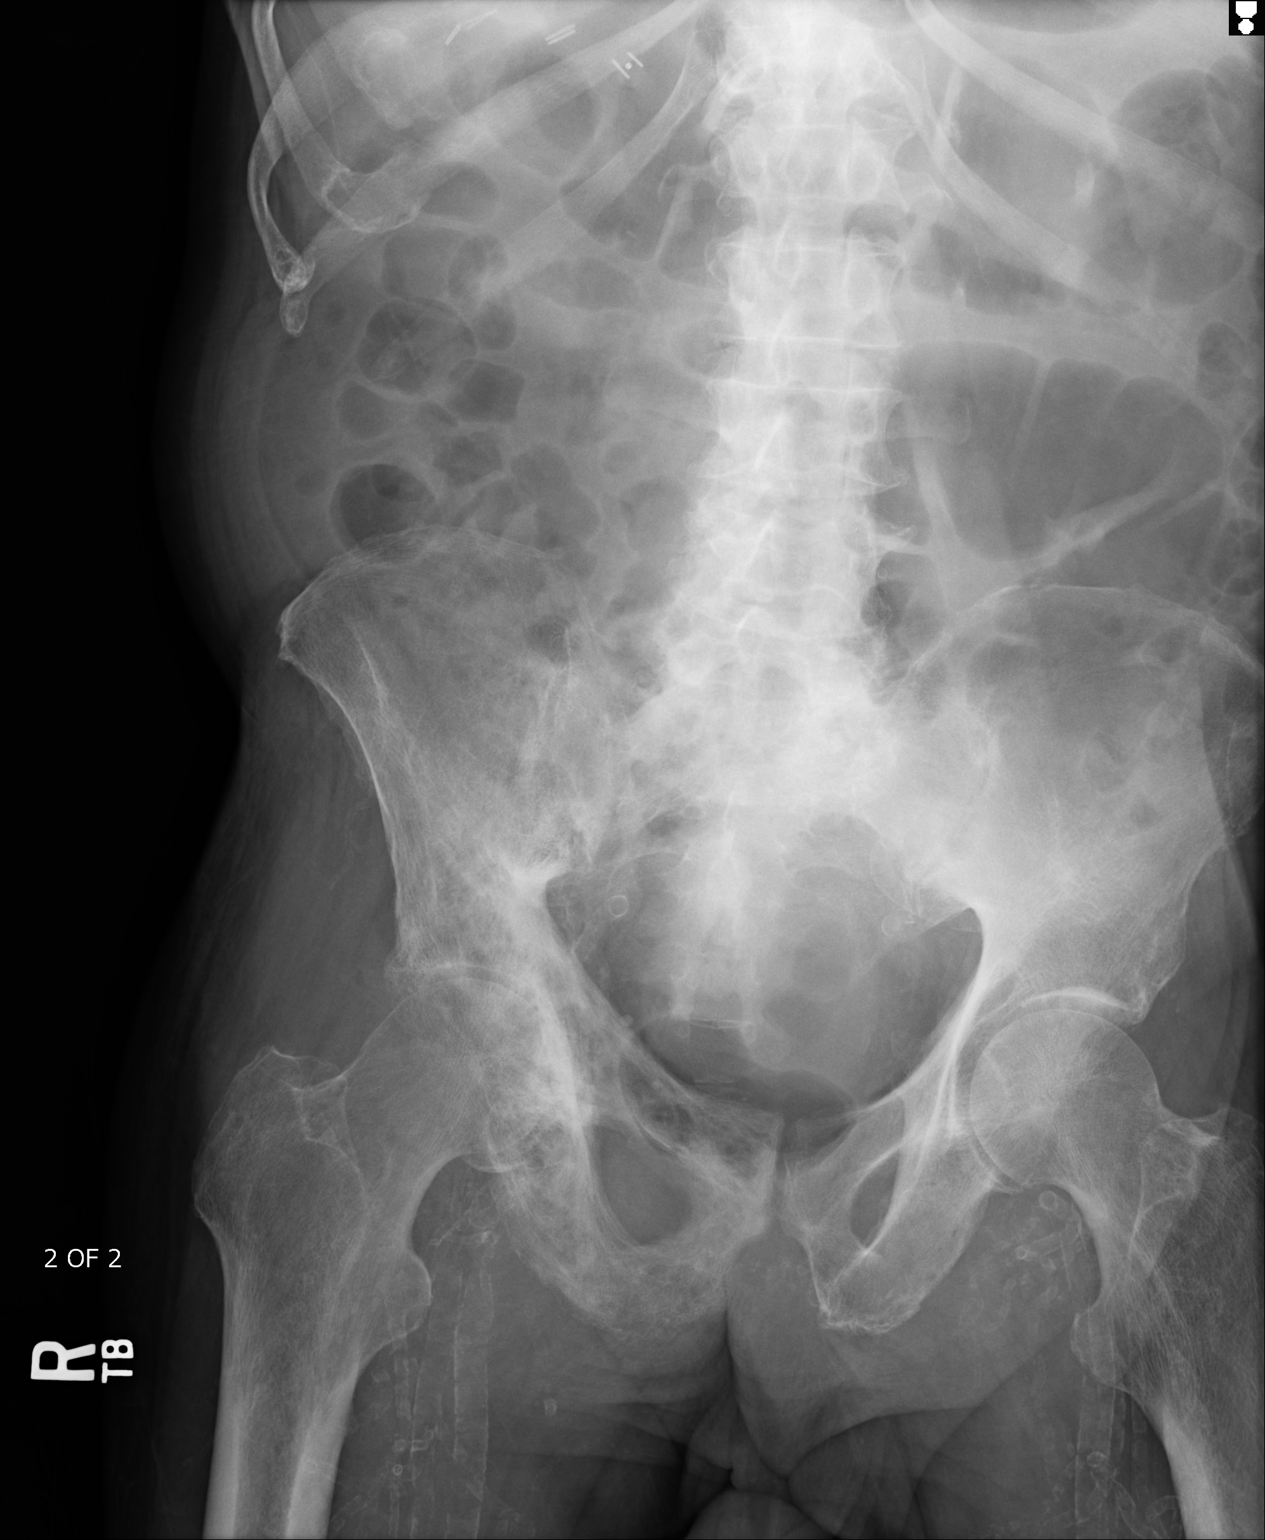

[2 of 2 positions shown; findings below may reference images not displayed]

PROCEDURE:     DXR - DXR ABDOMEN AP ONLY  - April 20, 2011  [DATE]

RESULT:     There is an abnormal appearance of the bony structures of the
right hip consistent with severe degenerative change and sclerosis. The
possibility of underlying sclerotic malignant disease or Paget's disease
should be considered. There is an abnormal appearance within the sacrum,
sacroiliac joints and lumbar spine with similar differential considerations.
Air and fecal material is scattered through the colon to the rectum. There
is air within loops of small bowel without abnormal distention. No findings
are suggestive of obstruction. Mild ileus is not excluded.
IMPRESSION: 1. Abnormal appearance within the bony structures in the right hip and
pelvis and to a lesser extent in the lumbar spine. Is there history of
Paget's disease or other benign process or of known malignant disease?
2. Prominent atherosclerotic calcification.
3. No definite bowel obstruction or perforation. Mild ileus is not excluded.

## 2013-11-07 ENCOUNTER — Inpatient Hospital Stay: Payer: Self-pay | Admitting: Internal Medicine

## 2013-11-07 LAB — DIFFERENTIAL
Bands: 5 %
LYMPHS PCT: 5 %
MONOS PCT: 7 %
Metamyelocyte: 1 %
Myelocyte: 2 %
Segmented Neutrophils: 80 %

## 2013-11-07 LAB — CBC
HCT: 41.5 % (ref 40.0–52.0)
HGB: 13.2 g/dL (ref 13.0–18.0)
MCH: 28.1 pg (ref 26.0–34.0)
MCHC: 31.9 g/dL — ABNORMAL LOW (ref 32.0–36.0)
MCV: 88 fL (ref 80–100)
Platelet: 463 10*3/uL — ABNORMAL HIGH (ref 150–440)
RBC: 4.72 10*6/uL (ref 4.40–5.90)
RDW: 15.1 % — AB (ref 11.5–14.5)
WBC: 41.7 10*3/uL — ABNORMAL HIGH (ref 3.8–10.6)

## 2013-11-07 LAB — COMPREHENSIVE METABOLIC PANEL
ALT: 16 U/L
ANION GAP: 13 (ref 7–16)
AST: 20 U/L (ref 15–37)
Albumin: 2.3 g/dL — ABNORMAL LOW (ref 3.4–5.0)
Alkaline Phosphatase: 121 U/L — ABNORMAL HIGH
BUN: 50 mg/dL — AB (ref 7–18)
Bilirubin,Total: 0.7 mg/dL (ref 0.2–1.0)
CHLORIDE: 100 mmol/L (ref 98–107)
Calcium, Total: 8 mg/dL — ABNORMAL LOW (ref 8.5–10.1)
Co2: 18 mmol/L — ABNORMAL LOW (ref 21–32)
Creatinine: 2.93 mg/dL — ABNORMAL HIGH (ref 0.60–1.30)
EGFR (African American): 21 — ABNORMAL LOW
GFR CALC NON AF AMER: 18 — AB
Glucose: 140 mg/dL — ABNORMAL HIGH (ref 65–99)
Osmolality: 278 (ref 275–301)
POTASSIUM: 4 mmol/L (ref 3.5–5.1)
SODIUM: 131 mmol/L — AB (ref 136–145)
Total Protein: 6.8 g/dL (ref 6.4–8.2)

## 2013-11-07 LAB — URINALYSIS, COMPLETE
BILIRUBIN, UR: NEGATIVE
Bacteria: NONE SEEN
Blood: NEGATIVE
GLUCOSE, UR: NEGATIVE mg/dL (ref 0–75)
Ketone: NEGATIVE
LEUKOCYTE ESTERASE: NEGATIVE
Nitrite: NEGATIVE
Ph: 5 (ref 4.5–8.0)
Protein: NEGATIVE
SQUAMOUS EPITHELIAL: NONE SEEN
Specific Gravity: 1.015 (ref 1.003–1.030)

## 2013-11-07 LAB — PHOSPHORUS: Phosphorus: 4.4 mg/dL (ref 2.5–4.9)

## 2013-11-07 LAB — PROTIME-INR
INR: 1.2
Prothrombin Time: 15.1 secs — ABNORMAL HIGH (ref 11.5–14.7)

## 2013-11-07 LAB — MAGNESIUM: Magnesium: 2 mg/dL

## 2013-11-07 LAB — TROPONIN I: Troponin-I: <0.02 ng/mL

## 2013-11-07 LAB — LIPASE, BLOOD: LIPASE: 57 U/L — AB (ref 73–393)

## 2013-11-07 LAB — CLOSTRIDIUM DIFFICILE(ARMC)

## 2013-11-08 LAB — CBC WITH DIFFERENTIAL/PLATELET
Bands: 2 %
Eosinophil: 1 %
HCT: 39.2 % — AB (ref 40.0–52.0)
HGB: 12.1 g/dL — AB (ref 13.0–18.0)
LYMPHS PCT: 4 %
MCH: 27.1 pg (ref 26.0–34.0)
MCHC: 30.9 g/dL — ABNORMAL LOW (ref 32.0–36.0)
MCV: 88 fL (ref 80–100)
MONOS PCT: 8 %
Metamyelocyte: 4 %
Myelocyte: 2 %
PLATELETS: 507 10*3/uL — AB (ref 150–440)
RBC: 4.48 10*6/uL (ref 4.40–5.90)
RDW: 15.2 % — AB (ref 11.5–14.5)
Segmented Neutrophils: 79 %
WBC: 46.6 10*3/uL — ABNORMAL HIGH (ref 3.8–10.6)

## 2013-11-08 LAB — BASIC METABOLIC PANEL
Anion Gap: 15 (ref 7–16)
BUN: 50 mg/dL — AB (ref 7–18)
CALCIUM: 7.3 mg/dL — AB (ref 8.5–10.1)
CO2: 14 mmol/L — AB (ref 21–32)
Chloride: 107 mmol/L (ref 98–107)
Creatinine: 2.59 mg/dL — ABNORMAL HIGH (ref 0.60–1.30)
EGFR (African American): 25 — ABNORMAL LOW
GFR CALC NON AF AMER: 21 — AB
GLUCOSE: 132 mg/dL — AB (ref 65–99)
Osmolality: 287 (ref 275–301)
Potassium: 3.6 mmol/L (ref 3.5–5.1)
Sodium: 136 mmol/L (ref 136–145)

## 2013-11-08 LAB — TSH: THYROID STIMULATING HORM: 9.51 u[IU]/mL — AB

## 2013-11-08 LAB — URINE CULTURE

## 2013-11-09 LAB — CBC WITH DIFFERENTIAL/PLATELET
BANDS NEUTROPHIL: 16 %
HCT: 35.4 % — ABNORMAL LOW (ref 40.0–52.0)
HGB: 11 g/dL — AB (ref 13.0–18.0)
Lymphocytes: 9 %
MCH: 27.5 pg (ref 26.0–34.0)
MCHC: 31.1 g/dL — ABNORMAL LOW (ref 32.0–36.0)
MCV: 88 fL (ref 80–100)
METAMYELOCYTE: 2 %
Monocytes: 5 %
Myelocyte: 1 %
Platelet: 523 10*3/uL — ABNORMAL HIGH (ref 150–440)
RBC: 4.01 10*6/uL — AB (ref 4.40–5.90)
RDW: 15.2 % — ABNORMAL HIGH (ref 11.5–14.5)
SEGMENTED NEUTROPHILS: 67 %
WBC: 50.6 10*3/uL — AB (ref 3.8–10.6)

## 2013-11-09 LAB — BASIC METABOLIC PANEL
Anion Gap: 10 (ref 7–16)
BUN: 56 mg/dL — AB (ref 7–18)
CALCIUM: 7.9 mg/dL — AB (ref 8.5–10.1)
CHLORIDE: 109 mmol/L — AB (ref 98–107)
Co2: 16 mmol/L — ABNORMAL LOW (ref 21–32)
Creatinine: 3.27 mg/dL — ABNORMAL HIGH (ref 0.60–1.30)
EGFR (African American): 19 — ABNORMAL LOW
GFR CALC NON AF AMER: 16 — AB
Glucose: 113 mg/dL — ABNORMAL HIGH (ref 65–99)
OSMOLALITY: 286 (ref 275–301)
Potassium: 3.3 mmol/L — ABNORMAL LOW (ref 3.5–5.1)
Sodium: 135 mmol/L — ABNORMAL LOW (ref 136–145)

## 2013-11-09 LAB — MAGNESIUM: Magnesium: 1.6 mg/dL — ABNORMAL LOW

## 2013-11-10 LAB — COMPREHENSIVE METABOLIC PANEL
Albumin: 1.9 g/dL — ABNORMAL LOW (ref 3.4–5.0)
Alkaline Phosphatase: 85 U/L
Anion Gap: 13 (ref 7–16)
BILIRUBIN TOTAL: 0.4 mg/dL (ref 0.2–1.0)
BUN: 49 mg/dL — ABNORMAL HIGH (ref 7–18)
CO2: 14 mmol/L — AB (ref 21–32)
CREATININE: 2.85 mg/dL — AB (ref 0.60–1.30)
Calcium, Total: 7.6 mg/dL — ABNORMAL LOW (ref 8.5–10.1)
Chloride: 112 mmol/L — ABNORMAL HIGH (ref 98–107)
EGFR (African American): 22 — ABNORMAL LOW
GFR CALC NON AF AMER: 19 — AB
Glucose: 116 mg/dL — ABNORMAL HIGH (ref 65–99)
Osmolality: 291 (ref 275–301)
POTASSIUM: 3.7 mmol/L (ref 3.5–5.1)
SGOT(AST): 23 U/L (ref 15–37)
SGPT (ALT): 11 U/L — ABNORMAL LOW
SODIUM: 139 mmol/L (ref 136–145)
Total Protein: 6 g/dL — ABNORMAL LOW (ref 6.4–8.2)

## 2013-11-10 LAB — CBC WITH DIFFERENTIAL/PLATELET
Bands: 2 %
HCT: 36.9 % — ABNORMAL LOW (ref 40.0–52.0)
HGB: 11.2 g/dL — ABNORMAL LOW (ref 13.0–18.0)
Lymphocytes: 10 %
MCH: 26.8 pg (ref 26.0–34.0)
MCHC: 30.4 g/dL — ABNORMAL LOW (ref 32.0–36.0)
MCV: 88 fL (ref 80–100)
METAMYELOCYTE: 1 %
Monocytes: 11 %
Myelocyte: 2 %
PLATELETS: 593 10*3/uL — AB (ref 150–440)
RBC: 4.19 10*6/uL — AB (ref 4.40–5.90)
RDW: 15.7 % — ABNORMAL HIGH (ref 11.5–14.5)
SEGMENTED NEUTROPHILS: 74 %
WBC: 52.1 10*3/uL — AB (ref 3.8–10.6)

## 2013-11-10 LAB — MAGNESIUM: Magnesium: 1.9 mg/dL

## 2013-11-11 DIAGNOSIS — I359 Nonrheumatic aortic valve disorder, unspecified: Secondary | ICD-10-CM

## 2013-11-11 LAB — TROPONIN I: Troponin-I: <0.02 ng/mL

## 2013-11-11 LAB — CBC WITH DIFFERENTIAL/PLATELET
Bands: 5 %
HCT: 34.3 % — ABNORMAL LOW (ref 40.0–52.0)
HGB: 10.5 g/dL — AB (ref 13.0–18.0)
Lymphocytes: 3 %
MCH: 27.1 pg (ref 26.0–34.0)
MCHC: 30.7 g/dL — ABNORMAL LOW (ref 32.0–36.0)
MCV: 88 fL (ref 80–100)
METAMYELOCYTE: 4 %
MYELOCYTE: 3 %
Monocytes: 6 %
Platelet: 598 10*3/uL — ABNORMAL HIGH (ref 150–440)
RBC: 3.88 10*6/uL — ABNORMAL LOW (ref 4.40–5.90)
RDW: 15.9 % — AB (ref 11.5–14.5)
Segmented Neutrophils: 79 %
WBC: 54.8 10*3/uL — ABNORMAL HIGH (ref 3.8–10.6)

## 2013-11-11 LAB — BASIC METABOLIC PANEL
ANION GAP: 13 (ref 7–16)
BUN: 65 mg/dL — ABNORMAL HIGH (ref 7–18)
CALCIUM: 7.7 mg/dL — AB (ref 8.5–10.1)
CHLORIDE: 113 mmol/L — AB (ref 98–107)
CREATININE: 2.9 mg/dL — AB (ref 0.60–1.30)
Co2: 12 mmol/L — ABNORMAL LOW (ref 21–32)
EGFR (African American): 22 — ABNORMAL LOW
EGFR (Non-African Amer.): 19 — ABNORMAL LOW
GLUCOSE: 164 mg/dL — AB (ref 65–99)
Osmolality: 298 (ref 275–301)
Potassium: 3.9 mmol/L (ref 3.5–5.1)
Sodium: 138 mmol/L (ref 136–145)

## 2013-11-11 LAB — CK: CK, Total: 108 U/L

## 2013-11-12 LAB — BASIC METABOLIC PANEL
Anion Gap: 14 (ref 7–16)
BUN: 77 mg/dL — AB (ref 7–18)
CALCIUM: 7.8 mg/dL — AB (ref 8.5–10.1)
CO2: 13 mmol/L — AB (ref 21–32)
Chloride: 116 mmol/L — ABNORMAL HIGH (ref 98–107)
Creatinine: 3.11 mg/dL — ABNORMAL HIGH (ref 0.60–1.30)
EGFR (Non-African Amer.): 17 — ABNORMAL LOW
EGFR (Non-African Amer.): 17 — ABNORMAL LOW
GFR CALC AF AMER: 20 — AB
GFR CALC AF AMER: 20 — AB
Glucose: 166 mg/dL — ABNORMAL HIGH (ref 65–99)
OSMOLALITY: 312 (ref 275–301)
POTASSIUM: 4.1 mmol/L (ref 3.5–5.1)
Sodium: 143 mmol/L (ref 136–145)

## 2013-11-12 LAB — CBC WITH DIFFERENTIAL/PLATELET
Bands: 5 %
HCT: 32.7 % — AB (ref 40.0–52.0)
HGB: 10 g/dL — ABNORMAL LOW (ref 13.0–18.0)
LYMPHS PCT: 3 %
MCH: 27 pg (ref 26.0–34.0)
MCHC: 30.6 g/dL — ABNORMAL LOW (ref 32.0–36.0)
MCV: 88 fL (ref 80–100)
METAMYELOCYTE: 4 %
Monocytes: 5 %
Myelocyte: 3 %
Platelet: 519 10*3/uL — ABNORMAL HIGH (ref 150–440)
RBC: 3.7 10*6/uL — AB (ref 4.40–5.90)
RDW: 16 % — AB (ref 11.5–14.5)
SEGMENTED NEUTROPHILS: 80 %
WBC: 50.4 10*3/uL — ABNORMAL HIGH (ref 3.8–10.6)

## 2013-11-12 LAB — TROPONIN I
Troponin-I: 0.02 ng/mL
Troponin-I: <0.02 ng/mL

## 2013-11-12 LAB — CULTURE, BLOOD (SINGLE)

## 2013-11-12 LAB — CK
CK, Total: 90 U/L
CK, Total: 84 U/L

## 2013-12-02 DEATH — deceased

## 2014-06-21 NOTE — Consult Note (Signed)
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  Dr Cleda Mccreedy did surgery however, the viability is in question. He was discharged and has been on ciprofloxacin and also Bactrim antibiotic. He is still continuing to take that and has been having diarrhea for a few days on and off and became progressively more lethargic. In the ER, he was found to have acute renal failure with creatinine of 2.81. His last creatinine about three weeks ago at the time of discharge was 1.23 with a GFR greater than 60. His potassium today is 6.2.  We are asked to evaluate the viability of the patietn's foot.   PAST MEDICAL HISTORY:  1. History of CVA with left-sided hemiparesis, mostly bedbound/wheelchair bound at baseline.  2. Hypertension. 3. Gastroesophageal reflux disease.  4. Diabetes.  5. Legally blind in the left eye. 6. Severe peripheral vascular disease.     PAST SURGICAL HISTORY:  1. Cholecystectomy.  2. Multiple angioplasties of lower extremities for peripheral vascular disease.  3. Left foot second toe amputation.   Home Medications: Medication Instructions Status  Aggrenox 25 mg-200 mg oral capsule, extended release 1 cap(s) orally 2 times a day Active  multivitamin 1  orally once a day Active  Colace 100 mg oral capsule 1 cap(s) orally 2 times a day Active  glimepiride 1 mg oral tablet 1 tab(s) orally once a day Active  Vitamin D3 1000 intl units oral tablet 1 tab(s) orally once a day Active  Tylenol 325 mg oral tablet 1-2 tab(s) orally every 4 hours, As Needed- for Pain   Active  amlodipine-benazepril 5 mg-20 mg oral capsule 1 cap(s) orally once a day Active  Travatan Z 0.004% ophthalmic solution 1 drop(s) to each affected eye once a day (in the evening) Active  chlorproMAZINE 10 mg oral tablet 1 tab(s) orally every 8 hours Active  MiraLax oral powder for reconstitution 17 gram(s) orally once a day Active  omeprazole 20 mg oral delayed release capsule 1 cap(s) orally 2 times a day Active  robitussin dm max 1  tsp every 4-6 hours as needed for cough Active  acetaminophen-oxycodone 325 mg-5 mg oral tablet 1 tab(s) orally every 4 hours, As Needed- for Pain  Active    No Known Allergies:   Case History:   Family History Non-Contributory    Social History negative tobacco, negative ETOH, negative Illicit drugs   Review of Systems:   Fever/Chills No    Cough No    Sputum No    Abdominal Pain No    Diarrhea Yes    Constipation No    Nausea/Vomiting No    SOB/DOE No    Chest Pain No    Telemetry Reviewed NSR    Dysuria No   Physical Exam:   GEN well developed, no acute distress    HEENT hearing intact to voice, poor dentition    NECK supple  trachea midline    RESP normal resp effort  no use of accessory muscles    CARD regular rate  no JVD    ABD denies tenderness  soft  EXTR negative cyanosis/clubbing, negative edema    SKIN positive ulcers, skin turgor poor    NEURO L side weakness, follows commands, left leg contracture    PSYCH alert, A+O to time, place, person   Nursing/Ancillary Notes: **Vital Signs.:   24-Oct-13 08:57   Vital Signs Type Admission   Temperature Temperature (F) 97.6   Celsius 36.4   Temperature Source Oral   Pulse Pulse 98   Respirations Respirations 18   Systolic BP Systolic BP 130   Diastolic BP (mmHg) Diastolic BP (mmHg) 84   Mean BP 99   Pulse Ox % Pulse Ox % 98   Pulse Ox Activity Level  At rest   Oxygen Delivery Room Air/ 21 %    13:02   Vital Signs Type Routine   Temperature  Temperature (F) 97.8   Celsius 36.5   Temperature Source AdultAxillary   Pulse Pulse 88   Respirations Respirations 18   Systolic BP Systolic BP 116   Diastolic BP (mmHg) Diastolic BP (mmHg) 68   Mean BP 84   Pulse Ox % Pulse Ox % 98   Pulse Ox Activity Level  At rest   Oxygen Delivery Room Air/ 21 %   Hepatic:  24-Oct-13 04:22    Bilirubin, Total 0.4   Alkaline Phosphatase 106   SGPT (ALT) 30   SGOT (AST) 23   Total Protein, Serum  8.4   Albumin, Serum 3.7  Cardiology:  24-Oct-13 04:14    Ventricular Rate 91   Atrial Rate 91   P-R Interval 182   QRS Duration 94   QT 344   QTc 423   P Axis 75   R Axis -64   T Axis 70   ECG interpretation Sinus rhythm with occasional Premature ventricular complexes Left axis deviation Abnormal ECG When compared with ECG of 13-Dec-2011 00:50, PREVIOUS ECG IS PRESENT ----------unconfirmed---------- Confirmed by OVERREAD, NOT ([id number]), editor PEARSON,BARBARA ([id number]) on 12/26/2011 12:52:18 PM  Routine Chem:  24-Oct-13 04:22    Potassium, Serum  6.2   Glucose, Serum  102   BUN  76   Creatinine (comp)  2.81   Sodium, Serum 138   Chloride, Serum  110   CO2, Serum  18   Calcium (Total), Serum 9.2   Anion Gap 10   Osmolality (calc) 298   eGFR (African American)  23   eGFR (Non-African American)  20 (eGFR values <60mL/min/1.73 m2 may be an indication of chronic kidney disease (CKD). Calculated eGFR is useful in patients with stable renal function. The eGFR calculation will not be reliable in acutely ill patients when serum creatinine is changing rapidly. It is not useful in  patients on dialysis. The eGFR calculation may not be applicable to patients at the low and high extremes of body sizes, pregnant women, and vegetarians.)   Hemoglobin A1c ([facility]) 4.7 (The American Diabetes Association recommends that a primary goal of therapy should be <7% and that physicians should reevaluate the treatment regimen in patients with HbA1c values  consistently >8%.)    12:25    Potassium, Serum  5.9   Glucose, Serum  197   BUN  61   Creatinine (comp)  2.21   Sodium, Serum 137   Chloride, Serum  110   CO2, Serum  19   Calcium (Total), Serum 8.8   Anion Gap 8   Osmolality (calc) 297   eGFR (African American)  31   eGFR (Non-African American)  26 (eGFR values <60mL/min/1.73 m2 may be   an indication of chronic kidney disease (CKD). Calculated eGFR is useful in patients with stable renal function. The eGFR calculation will not be reliable in acutely ill patients when serum creatinine is changing rapidly. It is not useful in  patients on dialysis. The eGFR calculation may not be applicable to patients at the low and high extremes of body sizes, pregnant women, and vegetarians.)    15:00    Potassium, Serum  5.7 (Result(s) reported on 26 Dec 2011 at 03:40PM.)  Cardiac:  24-Oct-13 04:22    Troponin I < 0.02 (0.00-0.05 0.05 ng/mL or less: NEGATIVE  Repeat testing in 3-6 hrs  if clinically indicated. >0.05 ng/mL: POTENTIAL  MYOCARDIAL INJURY. Repeat  testing in 3-6 hrs if  clinically indicated. NOTE: An increase or decrease  of 30% or more on serial  testing suggests a  clinically important change)  Routine UA:  24-Oct-13 04:32    Color (UA) Yellow   Clarity (UA) Hazy   Glucose (UA) Negative   Bilirubin (UA) Negative   Ketones (UA) Negative   Specific Gravity (UA) 1.014   Blood (UA) Negative   pH (UA) 5.0   Protein (UA) Negative   Nitrite (UA) Negative   Leukocyte Esterase (UA) Negative (Result(s) reported on 26 Dec 2011 at 04:48AM.)   RBC (UA) <1 /HPF   WBC (UA) 1 /HPF   Bacteria (UA) NONE SEEN   Epithelial Cells (UA) NONE SEEN   Hyaline Cast (UA) 3 /LPF (Result(s) reported on 26 Dec 2011 at 04:48AM.)  Routine Hem:  24-Oct-13 04:22    WBC (CBC)  14.7   RBC (CBC)  3.05   Hemoglobin (CBC)  9.2   Hematocrit (CBC)  28.3   Platelet Count (CBC)  489   MCV 93   MCH 30.1   MCHC 32.4   RDW 14.2   Neutrophil %  76.5   Lymphocyte % 15.3   Monocyte % 5.5   Eosinophil % 2.2   Basophil % 0.5   Neutrophil #  11.3   Lymphocyte # 2.3   Monocyte # 0.8   Eosinophil # 0.3   Basophil # 0.1 (Result(s) reported on 26 Dec 2011 at 04:41AM.)     Impression 1.  ASO bilateral lower extremities with gangrene of the feet          Dr Dew has been following          will discuss with Dr Cline           if angiography is contemplated will need to see improved renal function 2.  Ulceration bilateral feet          Podiatry following 3.  Acute renal insufficiency          Hydrate           avoid nephrotoxic medications    Plan level 3   Electronic Signatures: Schnier, Gregory (MD)  (Signed 24-Oct-13 17:56)  Authored: General Aspect/Present Illness, Home Medications, Allergies, History and Physical Exam, Vital Signs, Labs, Impression/Plan   Last Updated: 24-Oct-13 17:56 by Schnier, Gregory (MD) 

## 2014-06-21 NOTE — H&P (Signed)
PATIENT NAME:  Galvin ProfferROGERS, Ernan L MR#:  [id number] DATE OF BIRTH:  [birth year]  DATE OF ADMISSION:  11/29/2011  HISTORY OF PRESENT ILLNESS: This is an 81 year old male with long-term history of peripheral vascular disease in his lower extremities. Recently underwent revascularization on the left leg on the 16th. Presented to the office today with a gangrenous tip of his left second toe. Referred over from home health care because of the recent changes. Decision was made for hospitalization and amputation of the left second toe.   PAST MEDICAL HISTORY:  1. Peripheral vascular disease. 2. Non-insulin-requiring diabetes.  3. Hypertension. 4. Acid reflux. 5. History of ulcerations bilateral feet.   PAST SURGICAL HISTORY: Bilateral vascular intervention lower extremities. Cannot obtain any other history.   MEDICATIONS:  1. Acetaminophen 325 mg one every 4 to 6 hours p.r.n. pain.  2. Aggrenox 25 mg 200 mg q.12 hours.  3. Amlodipine/benazepril 5 mg/20 mg 1 p.o. daily. 4. Chlorpromazine HCL 10 mg tablet, one every eight hours as needed for hiccups. 5. Docusate sodium 100 mg capsule p.r.n.  6. Glimepiride 1 mg tablet 1 p.o. daily. 7. Multivitamin tablet 1 daily. 8. Olanzapine 5 mg tablet 1 p.r.n. bedtime.  9. Omeprazole 20 mg tablet one p.o. b.i.d.  10. Robitussin-DM Max 200 mg/10 mg 5 mL every 4 to 6 hours as needed. 11. Oxycodone 7.5/325, 1 q.4 hours p.r.n. pain.    ALLERGIES: No known drug allergies.   FAMILY HISTORY: Unobtainable.   SOCIAL HISTORY: Lives with his girlfriend. No alcohol related. History of tobacco use.  REVIEW OF SYSTEMS: Unobtainable.   PHYSICAL EXAMINATION:  VASCULAR: DP and PT pulses are trace to nonpalpable. Capillary filling time is delayed. No refill distal tip of the left second toe which is gangrenous.   NEUROLOGICAL: Epicritic sensations are grossly intact bilateral.   INTEGUMENT: Skin is warm, dry, and atrophic. Gangrenous distal one half of the left second  toe. Does probe down to bone. Scabbed over ulcerations are noted on the right hallux distally and laterally. No signs of cellulitis on the right. There is some mild edema and erythema in the left forefoot.   MUSCULOSKELETAL: Limited range of motion. Muscle mass and tone is diminished.   IMPRESSION:  1. Gangrene, left second toe.  2. Peripheral vascular disease.  3. Diabetes.   PLAN: Patient is admitted for amputation of the left second toe. I did speak with Dr. Wyn Quakerew who recommended proceeding with the amputation and if signs of nonhealing may have to re-angiogram next week. Consult [facility] for medical history and physical and medical management. We will plan for surgery tomorrow morning.   ____________________________ Linus Galasodd Averyanna Sax, DPM tc:cms D: 11/29/2011 13:50:22 ET T: 11/29/2011 14:30:32 ET JOB#: [id number]  cc: Linus Galasodd Moua Rasmusson, DPM, <Dictator>  Rip Hawes DPM ELECTRONICALLY SIGNED 11/30/2011 8:57

## 2014-06-21 NOTE — Discharge Summary (Signed)
PATIENT NAME:  Melvin Obrien, Melvin Obrien MR#:  [id number] DATE OF BIRTH:  December 03, 1924  DATE OF ADMISSION:  01/14/2012 DATE OF DISCHARGE:  01/20/2012   ADMITTING/DISCHARGE DIAGNOSES:  1. Gangrene of the left foot.  2. Osteomyelitis of the right great toe.  3. Acute renal failure, resolved.  4. Failure to thrive.  5. History of stroke.   PROCEDURE PERFORMED: Left above-knee amputation and a right great toe amputation on 01/17/2012.   BRIEF HISTORY: This is an 81 year old African American male well-known to me for his peripheral vascular disease and nonhealing ulcerations. He has developed gangrenous changes to his left foot and osteomyelitis of the right great toe. He has had revascularizations and he has had multiple procedures by podiatry but despite this, he continues to have worsening disease. He is brought in prior to his scheduled surgery for failure to thrive and acute renal failure and medical optimization.   HOSPITAL COURSE: The patient was admitted prior to his planned outpatient operation for a left above-knee amputation and a right great toe amputation for gangrene and osteomyelitis as he was having failure to thrive at home. When he was admitted he was in acute renal failure and had severe hyperkalemia. This was treated medically and did resolve with hydration and measures for potassium. His medical course stabilized over 48 to 72 hours. He was taken to the Operating Room on 01/17/2012 where a left above-knee amputation and a right great toe amputation was performed. He did well from this. His postoperative hemoglobin was stable at 8 and it was in the 8.5 range preoperatively. He did reasonably well for several days post surgery. His p.o. intake is still not ideal, but improved. He has clean, dry and intact incisions at the time of discharge and will be discharged to a skilled nursing facility. His diet will be Boost or Ensure supplements three times daily and push p.o. intake.   MEDICATIONS:   1. Percocet as needed for pain. 2. Robitussin as needed for cough.  3. Omeprazole 20 mg b.i.d. 4. MiraLAX as needed for constipation. 5. Chlorpromazine as needed.  6. Travatan eyedrops once daily. 7. Amlodipine/Benazepril 5/20 daily.  8. Vitamin D 1000 international units daily. 9. Glimepiride 1 mg daily. 10. Colace 100 mg b.i.d.  11. Multivitamin daily. 12. Tylenol as needed for pain.   FOLLOWUP: His return appointment will be in three weeks in our office for a wound check and staple removal. They will call or contact our office with any fever greater than 101, would redness or drainage, severe pain or other issues.    ____________________________ Annice NeedyJason S. Dew, MD jsd:ap D: 01/20/2012 09:54:42 ET T: 01/20/2012 10:32:17 ET JOB#: [id number]  cc: Annice NeedyJason S. Dew, MD, <Dictator> Annice NeedyJASON S DEW MD ELECTRONICALLY SIGNED 01/21/2012 15:04

## 2014-06-21 NOTE — Op Note (Signed)
PATIENT NAME:  Melvin Obrien, [name] MR#:  [id number] DATE OF BIRTH:  [birth year]  DATE OF PROCEDURE:  11/30/2011  PREOPERATIVE DIAGNOSIS: Gangrene, left second toe.   POSTOPERATIVE DIAGNOSIS: Gangrene, left second toe.  PROCEDURE: Amputation left second toe.   SURGEON: Linus Galasodd Ioma Chismar, DPM.   ANESTHESIA: Local MAC.   HEMOSTASIS: None.   ESTIMATED BLOOD LOSS: Minimal.   PATHOLOGY: Gangrenous left second toe.   CULTURES: Bone and soft tissue, left second toe.   COMPLICATIONS: None apparent.   OPERATIVE INDICATIONS: This is an 81 year old male with long-standing peripheral vascular disease with recent development of gangrene in his left second toe. A decision was made for hospitalization and amputation.   OPERATIVE PROCEDURE: The patient was taken to the Operating Room and placed on the table in the supine position. Following satisfactory sedation, the left foot was anesthetized with 10 mL of 0.5% Sensorcaine plain. The foot was prepped and draped in the usual sterile fashion. Attention was then directed to the distal aspect of the left foot where an elliptical incision was made coursing dorsal to plantar around the base of the second toe. The incision was carried sharply down to bone and periosteal dissection carried back to the level of the metatarsophalangeal joint where the toe was disarticulated and removed in toto. There was noted to be minimal bleeding with no tourniquet. The wound appeared to be granular. The wound was flushed with copious amounts of sterile saline and closed using 4-0 nylon vertical mattress in simple interrupted sutures. Xeroform and a sterile bandage was applied. The patient tolerated the procedure and anesthesia well and was transported to the postanesthesia care unit with vital signs stable and in good condition.   ____________________________ Linus Galasodd Marielouise Amey, DPM tc:cbb D: 11/30/2011 10:29:53 ET T: 11/30/2011 12:28:45 ET JOB#: [id number]  cc: Linus Galasodd Hooper Petteway, DPM,  <Dictator> Terryl Molinelli DPM ELECTRONICALLY SIGNED 12/20/2011 10:28

## 2014-06-21 NOTE — Consult Note (Signed)
Brief Consult Note: Diagnosis: anemia, heme positive stool.   Patient was seen by consultant.   Consult note dictated.   Recommend further assessment or treatment.   Orders entered.   Comments: Please see full GI consult [phone].  Patient admitted with AMS in the setting of uremia, severe PVD, recent toe amputation and recent increase od problems related to diarrhea.  Poor candidate for sedated luminal evaluation.  Initially recommend conservative evaluation to include stool cultures, qualitative fecal fat, WBC's and helicobacter pylori stool antigen and serology.  Will follow.  Electronic Signatures: Barnetta ChapelSkulskie, Martin (MD)  (Signed 26-Oct-13 17:52)  Authored: Brief Consult Note   Last Updated: 26-Oct-13 17:52 by Barnetta ChapelSkulskie, Martin (MD)

## 2014-06-21 NOTE — Op Note (Signed)
PATIENT NAME:  Melvin ProfferROGERS, Lionell L MR#:  [id number] DATE OF BIRTH:  Sep 11, 1924  DATE OF PROCEDURE:  01/17/2012  PREOPERATIVE DIAGNOSES:  1. Peripheral arterial disease. 2. Gangrene, left foot.  3. Osteomyelitis, right great toe.  4. Peripheral arterial disease status post revascularization.  5. Diabetes.  6. Stroke.   POSTOPERATIVE DIAGNOSES:  1. Peripheral arterial disease. 2. Gangrene, left foot.  3. Osteomyelitis, right great toe.  4. Peripheral arterial disease status post revascularization.  5. Diabetes.  6. Stroke.   PROCEDURES:  1. Right first ray amputation.  2. Left above-knee amputation.   SURGEON: Annice NeedyJason S. Dew, MD  ANESTHESIA: General.   ESTIMATED BLOOD LOSS: Approximately 200 mL.  INDICATION FOR PROCEDURE: 81 year old gentleman with the above-mentioned problems. He has become quite sick from his gangrenous left foot and needs to be removed. He also has osteomyelitis of the right great toe.   DESCRIPTION OF PROCEDURE: Initially the right foot was sterilely prepped and draped, a sterile surgical field was created. A curvilinear incision was made at the base of the toe. The toe was dissected out and disarticulated from the metatarsal head and then removed. The metatarsal head was then transected with bone cutters and made smooth with rongeurs. The wound was irrigated and closed with several figure of eight Vicryl sutures and five mattress nylon sutures. I then turned my attention to the left leg. A fishmouth incision made just above the left knee, dissected down through the muscle with electrocautery. Periosteal elevator was used to dissect the bone out well superior to the incision edge. The oscillating saw was then used to transect the bone. The posterior flap was finished with the amputation knife. The vessels were ligated with silk ties and silk suture ligatures. The nerve was controlled with a free tie. The wound was then irrigated and closed with a series of interrupted  figure-of-eight 0 Vicryl sutures first in the deep space and then the superficial fascia. Subcutaneous tissue was then closed with a running 2-0 Vicryl and the skin was coapted with staples. Sterile dressings were then placed. The patient tolerated procedure well, was taken to the recovery room in stable condition.   ____________________________ Annice NeedyJason S. Dew, MD jsd:cms D: 01/17/2012 14:05:00 ET T: 01/18/2012 09:42:13 ET JOB#: [id number]  cc: Annice NeedyJason S. Dew, MD, <Dictator> Annice NeedyJASON S DEW MD ELECTRONICALLY SIGNED 01/20/2012 9:05

## 2014-06-21 NOTE — Discharge Summary (Signed)
PATIENT NAME:  Melvin ProfferROGERS, Thadd L MR#:  [id number] DATE OF BIRTH:  06-09-1924  DATE OF ADMISSION:  11/29/2011 DATE OF DISCHARGE:  12/03/2011  PRIMARY DIAGNOSIS: Gangrene, left second toe.   SECONDARY DIAGNOSES:  1. Severe peripheral vascular disease. 2. Non-insulin-requiring diabetes. 3. Hypertension. 4. History of cerebrovascular accident.   PROCEDURES: Amputation of left second toe, 11/30/2011.   CONSULTANTS: [facility] for daily medical management.   HOSPITAL COURSE: The patient was admitted on 11/29/2011, initiated on Zosyn antibiotics, and stabilized for surgery, which was performed on 11/30/2011. The patient tolerated the procedure and anesthesia well. Postoperative course was uneventful with the patient remaining essentially afebrile throughout his hospital stay. Culture results returned with gram-negative rod growth. The incision remained coapted with no signs of skin slough or devitalization of the incision lines. The patient is stabilized for discharge on 01/02/2012.   DISCHARGE INSTRUCTIONS:  1. Levaquin 750 mg once daily. 2. Oxycodone 5/325 mg one p.o. every 4 hours p.r.n. pain.  3. The patient will keep the bandage clean, dry, and intact; do not remove. Sponge bathe only the left leg.  4. Wear surgical shoe on the left foot.         5. Follow-up next Monday outpatient with Dr. Alberteen Spindleline.   CONDITION AT DISCHARGE: The patient is discharged in stable and satisfactory condition.  ____________________________ Linus Galasodd Marlita Keil, DPM tc:slb D: 12/03/2011 08:12:47 ET T: 12/03/2011 11:00:12 ET JOB#: [id number]  cc: Linus Galasodd Husam Hohn, DPM, <Dictator> Arnel Wymer DPM ELECTRONICALLY SIGNED 12/20/2011 10:28

## 2014-06-21 NOTE — Consult Note (Signed)
PATIENT NAME:  Melvin Obrien, Melvin Obrien MR#:  [id number] DATE OF BIRTH:  March 06, 1924  DATE OF CONSULTATION:  11/29/2011  REFERRING PHYSICIAN:  Dr. Linus Galas, podiatry  CONSULTING PHYSICIAN:  Herschell Dimes. Renae Gloss, MD  PRIMARY CARE PHYSICIAN: [facility]   REASON FOR CONSULTATION: Preop and medical management.   HISTORY OF PRESENT ILLNESS: This is an 81 year old man who is a very poor historian. He has been having problems with his feet and now has gangrene of the left second toe, unclear how long he has had gangrene. He did have recent angiograms and angioplasties by Dr. Wyn Quaker September 15 and 16 on both lower extremities. Please see those operative reports. Patient was sent in for direct admission for amputation of the left second toe. The patient states that he has a jumpy foot and pain in the foot but other than that feels well. No complaints of chest pain or shortness of breath. On presentation to the hospital patient's white count is 13.1, platelet count elevated at 504 and patient is afebrile.   PAST MEDICAL HISTORY: Obtained from old chart includes:  1. History of cerebrovascular accident with left-sided weakness, mostly wheelchair-bound. 2. Hypertension. 3. Gastroesophageal reflux disease. 4. Diabetes. 5. Legally blind in left eye.  6. Peripheral vascular disease.  PAST SURGICAL HISTORY: Angioplasties of the lower extremity for peripheral vascular disease.  ALLERGIES: No known drug allergies.   MEDICATIONS: As per prescription writer include:  1. Aggrenox 25/200, 1 capsule twice a day.  2. Amlodipine/benazepril 5/20, 1 capsule daily.  3. Colace 100 mg twice a day. 4. Glimepiride 1 mg daily. 5. MiraLax 17 grams p.r.n. constipation.  6. Multivitamin 1 tablet daily.  7. Olanzapine 5 mg at bedtime.  8. Omeprazole 20 mg twice a day.  9. Oxycodone 1 tablet every six hours as needed.  10. Robitussin-DM as needed for cough.  11. Tylenol p.r.n. for pain.  12. Vitamin D3 1000  international units daily.   SOCIAL HISTORY: Lives with girlfriend who is power of attorney. Quit smoking in the past. Quit drinking in the past.   FAMILY HISTORY: As per old chart mother died in her 70s, unknown cause. When I asked the patient this time he said he has no idea about his family history.   REVIEW OF SYSTEMS: CONSTITUTIONAL: No fever, chills, or sweats. No weight loss. No weight gain. No weakness or fatigue. EYES: Left eye is blind. EARS, NOSE, MOUTH, AND THROAT: Decreased hearing. No sore throat. No difficulty swallowing. CARDIOVASCULAR: No chest pain. No palpitations. RESPIRATORY: No shortness of breath. No cough. No sputum. GASTROINTESTINAL: Occasional abdominal pain after eating. No nausea. No vomiting. No diarrhea. No constipation. GENITOURINARY: No burning on urination. No hematuria. MUSCULOSKELETAL: No joint pain or muscle pain. INTEGUMENTARY: Positive for gangrene of the left second toe. NEUROLOGIC: No fainting or blackouts. PSYCHIATRIC: On medication for restlessness. ENDOCRINE: No thyroid problems. HEMATOLOGIC/LYMPHATIC: No anemia. No easy bruising or bleeding.   PHYSICAL EXAMINATION:  VITAL SIGNS: Temperature 97, pulse 86, respirations 18, blood pressure 104/62.  GENERAL: No respiratory distress.   EYES: Left thigh clouded over. Right eye is reactive.   EARS, NOSE, MOUTH, AND THROAT: Nasal mucosa no erythema. Throat no erythema. No exudate seen. Lips and gums no lesions.   NECK: No JVD. No bruits. No lymphadenopathy. No thyromegaly. No thyroid nodules palpated.   RESPIRATORY: Lungs clear to auscultation. No use of accessory muscles to breathe. No rhonchi, rales, or wheeze heard.   CARDIOVASCULAR: S1, S2 normal. 2/6 systolic ejection murmur. Carotid  upstroke 2+ bilaterally. No bruits. Dorsalis pedis pulses difficult to palpate bilaterally. No edema of the lower extremities.   ABDOMEN: Soft, nontender. No organomegaly/splenomegaly. Normoactive bowel sounds. No masses  felt.   LYMPHATIC: No lymph nodes in the neck.   MUSCULOSKELETAL: No clubbing, edema, or cyanosis.   SKIN: Chronic lower extremity discoloration. Left second toe gangrenous at the distal part. Small healing ulcer of the right first toe edge with scab.   PSYCHIATRIC: Patient is alert, oriented, not the best historian.   LABORATORY, DIAGNOSTIC AND RADIOLOGICAL DATA: White blood count 13.1, hemoglobin and hematocrit 11.8 and 36.6, platelet count 504, glucose 89, BUN 41, creatinine 1.23, sodium 142, potassium 4.2, chloride 108, CO2 28, calcium 10.0, GFR 54. EKG shows normal sinus rhythm, 82 beats per minute, left anterior fascicular block.   ASSESSMENT AND PLAN:  1. Preoperative evaluation for left second toe gangrenous area that needs removal. Will start on preop Toprol-XL, which is a beta blocker. Surgery must be done to prevent sepsis even though patient is a moderate to high risk for procedure complications. Empiric Zosyn given at this time. With the patient's history of peripheral vascular disease wondering if the patient will heal just with a toe amputation, may need a further up higher amputation.  2. History of cerebrovascular accident. Start Aggrenox after surgery.  3. Hypertension. Blood pressure on the lower side, will hold Lotrel at this time while trying to give Toprol-XL preop.  4. Diabetes. Will put on sliding scale prior to surgery. Hold oral medication.  5. Constipation. Continue MiraLax and Colace.  6. Gastroesophageal reflux disease. Continue omeprazole.  7. Chronic kidney disease. Creatinine slightly higher than previous. Gentle IV fluids ordered, but this is on chronic kidney disease stage III. 8. CODE STATUS: Patient is a FULL CODE. Overall prognosis poor.   TIME SPENT ON CONSULTATION: 50 minutes.   ____________________________ Herschell Dimesichard J. Renae GlossWieting, MD rjw:cms D: 11/29/2011 14:29:00 ET T: 11/29/2011 15:18:54 ET JOB#: [id number]  cc: Herschell Dimesichard J. Renae GlossWieting, MD,  <Dictator> [facility] [place], [place] Salley ScarletICHARD J Kaidin Boehle MD ELECTRONICALLY SIGNED 12/03/2011 21:26

## 2014-06-21 NOTE — Consult Note (Signed)
PATIENT NAME:  Melvin Obrien, Melvin Obrien MR#:  [id number] DATE OF BIRTH:  [birth year]  DATE OF CONSULTATION:  12/28/2011  REFERRING PHYSICIAN:   Dr. Tressia Miners   CONSULTING PHYSICIAN:  Lollie Sails, MD  REASON FOR CONSULTATION: Anemia and Hemoccult positive stool.   HISTORY OF PRESENT ILLNESS: Mr. Hambly is an 81 year old male with a previous history of stroke with left hemiparesis, bed bound at baseline, as well as multiple other medical illnesses. He was admitted to the hospital with severe peripheral vascular disease about three weeks ago and had an amputation of the left foot second toe due to gangrene. His amputation was a below-knee amputation. He was admitted back to the hospital with altered mental status and acute renal failure on 12/26/2011. He was found to have a metabolic/uremic encephalopathy as well as acute renal failure. He was dehydrated from diarrhea. Over the course of his hospitalization it was noted that his hemoglobin was drifting down a bit and a Hemoccult card was done that was positive. The patient does not have a family members in the room and he is able to give only a very limited history. He denies any problems with nausea, vomiting, or abdominal pain. There is no heartburn or dysphagia. He states, "I have no stomach problems." He states, "My feet are just bothering me."   PAST MEDICAL HISTORY:   1. Cerebrovascular accident with left hemiparesis, mostly wheelchair/bedbound.  2. Gastroesophageal reflux for which he does take omeprazole daily.  3. Hypertension.  4. Diabetes.  5. Legally blind in the left eye, uncertain etiology. 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  6. History of severe peripheral vascular disease with left foot second toe amputation.  7. He has a history of multiple angioplasties of the lower extremities because of severe peripheral vascular disease.  8. He has also had a cholecystectomy.  9. Recent MRSA wound infection.  10. History of biliary  pancreatitis. 11. History of  GI bleed from Mallory-Weiss tear with EGD done in 2011.  12. Chronic back pain.  13. Osteoarthritis.  14. History of psychosis with inpatient behavioral stay.  15. History of ED.  16. Bilateral hearing loss.  17. History of alcohol and tobacco abuse, remote.   SOCIAL HISTORY: He lives at home with his girlfriend who is the power of attorney. He quit smoking two years ago. No recent alcohol use.   REVIEW OF SYSTEMS: Per admission History and Physical.   OUTPATIENT MEDICATIONS:  1. Acetaminophen/oxycodone 325/5 mg. 2. Aggrenox 25 mg/200 mg twice a day. 3. Amlodipine /benazepril 5/20 mg once a day.  4. Chlorpromazine 10 mg every eight hours p.r.n.  5. Colace 100 mg twice a day.  6. Glimepiride 1 mg once a day.  7. MiraLAX once a day.  8. Multiple vitamin once a day.  9. Omeprazole 20 mg a day.  10. Travatan eyedrops.  11. Tylenol 325 mg every four hours p.r.n.  12. Vitamin D3 1000 international units once a day.   PHYSICAL EXAMINATION:  VITAL SIGNS: Temperature 97.9, pulse 99, respirations 18, blood pressure 114/67.   GENERAL: He is an elderly appearing 81 year old African American male in no acute distress.   HEENT: Normocephalic, atraumatic. Left eye has a corneal leukoma. Right eye has some amount of opacity to the peripheral cornea. The patient states he does see, but not very well out of his right eye, none out of his left eye.  Nose- septum midline.  Oropharynx- no lesions.  NECK: No JVD.   HEART: Regular rate  and rhythm.   LUNGS: Clear.   ABDOMEN: Soft, nontender, nondistended. Bowel sounds positive, normoactive.   RECTAL: Anorectal examination shows a brownish watery stool that is trace Hemoccult positive.   ASSESSMENT: Hemoccult-positive stool and anemia in the setting of multiple medical problems as noted above. The patient apparently has never had a luminal evaluation in the past with the exception of an EGD in 2011, which was normal  with the exception of a Mallory-Weiss tear. Recovery from that was uneventful. The patient has been on a PPI.   Causes of Hemoccult positive stool in this patient show a broad differential. However, the patient is also taking Aggrenox twice a day, which would predispose toward bleeding if there was any particular lesion. The patient has also had diarrhea apparently for some time and was checked with C. difficile being negative. He has not had stool cultures otherwise. He is a poor candidate for sedated luminal evaluations currently due to his myriad clinical problems.   RECOMMENDATIONS:  1. Stool studies for culture and sensitivity, white blood cells, and qualitative fecal fat (please note history of alcohol use in the past).  2. Stool for Helicobacter pylori.  3. Transfuse as needed.   We will follow with you. Further recommendations to follow.     ____________________________ Lollie Sails, MD mus:bjt D: 12/28/2011 17:47:59 ET T: 12/29/2011 11:06:03 ET JOB#: [id number]  cc: Lollie Sails, MD, <Dictator> Lollie Sails MD ELECTRONICALLY SIGNED 01/02/2012 11:21

## 2014-06-21 NOTE — H&P (Signed)
PATIENT NAME:  Melvin Obrien, Melvin Obrien MR#:  [id number] DATE OF BIRTH:  [birth year]  DATE OF ADMISSION:  12/26/2011   ADMITTING PHYSICIAN: Enid Baasadhika Yaresly Menzel, MD   FAMILY MD: [facility]    CHIEF COMPLAINT: Confusion.   HISTORY OF PRESENT ILLNESS: Mr. Melvin Obrien is an 81 year old African American male with past medical history significant for cerebrovascular accident status post left hemiparesis and bedbound at baseline, blindness in the left eye, hypertension, diabetes, and severe peripheral vascular disease who was recently admitted to the hospital about three weeks ago for left foot second toe gangrene secondary to peripheral vascular disease and had amputation performed. He was discharged and has been on ciprofloxacin and also Bactrim antibiotics that were started on 12/11/2011. He is still continuing to take that and has been having diarrhea for a few days on and off according to his girlfriend who gives most of the history. She gave him his sleeping pill, which is Zyprexa, last night but couldn't wake him up at all so was concerned and brought him here to the Emergency Room. In the ER, he was found to have acute renal failure with creatinine of 2.81. His last creatinine about three weeks ago at the time of discharge was 1.23 with a GFR greater than 60. His potassium today is 6.2 and he is completely altered at the time of examination with some myoclonic jerks. According to the girlfriend, the patient's intake has been low the last couple of days and diarrhea has been worse and also she hasn't notice any decrease in urine output because the patient is incontinent. He is supposed to follow-up with Vascular and Podiatry this week for possible consideration of below-knee amputation as his ulcer is not healing at this time.   PAST MEDICAL HISTORY:  1. History of CVA with left-sided hemiparesis, mostly bedbound/wheelchair bound at baseline.  2. Hypertension. 3. Gastroesophageal reflux disease.   4. Diabetes.  5. Legally blind in the left eye. 6. Severe peripheral vascular disease.     PAST SURGICAL HISTORY:  1. Cholecystectomy.  2. Multiple angioplasties of lower extremities for peripheral vascular disease.  3. Left foot second toe amputation.   ALLERGIES: No known drug allergies.   CURRENT HOME MEDICATIONS:  1. Acetaminophen 325 mg 1 to 2 tablets every 4 to 6 hours as needed for pain.  2. Aggrenox 25/200 mg 1 capsule b.i.d.  3. Amlodipine/benazepril 5 mg/20 mg 1 tablet p.o. daily.  4. Chlorpromazine 10 mg every eight hours as needed for hiccups.  5. Colace 100 mg 1 to 2 tablets every day. 6. Glimepiride 1 mg oral daily.  7. MiraLAX oral p.r.n. for constipation.  8. Multivitamin 1 tablet p.o. daily.  9. Prilosec 20 mg p.o. b.i.d.  10. Oxycodone 5 mg every 4 to 6 hours as needed for pain.  11. Robitussin DM 5 mL every 4 to 6 hours for cough.  12. Travatan eyedrops one drop to both eyes daily.  13. Vitamin D3 1000 units capsule daily.  14. Cipro 500 mg 1 tablet p.o. b.i.d., started on 12/10/2011.   15. Bactrim double-strength 1 tablet p.o. b.i.d., started on 12/11/2011.   SOCIAL HISTORY: Currently lives at home with girlfriend who is his power-of-attorney. Quit smoking more than two years ago. No alcohol use.   FAMILY HISTORY: Not known.   REVIEW OF SYSTEMS: Difficult to be obtained secondary to the patient's mental status, however, seems to be improving slowly at this time with the help of him and his girlfriend's history. CONSTITUTIONAL:  No fevers, chills, or fatigue. EYES: Blind in left eye. Blurred vision in the right eye. ENT: Decreased hearing. No sore throat or difficulty swallowing. No tinnitus or ear pain. RESPIRATORY: No cough, wheeze, hemoptysis, or COPD. CARDIOVASCULAR: No chest pain, orthopnea, edema, arrhythmia, palpitations, or syncope. GI: No nausea or vomiting. Positive for diarrhea. No abdominal pain or constipation. GU: Decreased urination, incontinent. No  renal calculus, frequency, or hematuria. ENDOCRINE: No polyuria, nocturia, thyroid problems, heat or cold intolerance. HEMATOLOGY: No anemia, easy bruising or bleeding. SKIN: Ulcers are present in both feet. MUSCULOSKELETAL: No neck, back, shoulder pain, arthritis or gout. NEUROLOGIC: History of CVA with left-sided hemiparesis. PSYCHOLOGIC: No anxiety, insomnia, or depression.   PHYSICAL EXAMINATION:   VITAL SIGNS: Temperature 98.6 degrees Fahrenheit, pulse 89, respirations 18, blood pressure 113/59, pulse oximetry 99% on room air.  GENERAL: Well built, well nourished male laying in bed not in any acute distress.   HEENT: Normocephalic and atraumatic. Right pupil is 4 mm, sluggish reaction to light. Anicteric sclerae. Extraocular movements intact. Left cornea is opacified from chronic injury. Oropharynx clear without erythema, mass, or exudates.   NECK: Supple. No thyromegaly, JVD, or carotid bruits. No lymphadenopathy.   LUNGS: Moving air bilaterally. No wheeze or crackles. No use of accessory muscles for breathing.   CARDIOVASCULAR: S1, S2 regular rate and rhythm. No murmurs, rubs, or gallops.   ABDOMEN: Soft, nontender, nondistended. No hepatosplenomegaly. Normal bowel sounds.   EXTREMITIES: No pedal edema. No clubbing or cyanosis. Pedal pulses bilaterally. First two toes are wrapped, recent left foot second toe amputation. Feet were warm to touch. No other gangrenous changes noted.   SKIN: Other than the ulcers described below, no other changes seen on skin.   LYMPHATIC: No cervical lymphadenopathy.   NEUROLOGIC: Left-sided hemiparesis with 2/5 weakness which is old.   PSYCH: The patient is alert, oriented to self at this time. At baseline according to his caregiver he is oriented x3 and continues a conversation.   LABORATORY DATA: WBC 14.7, hemoglobin 9.2, hematocrit 28.3, platelet count 489, sodium 138, potassium 6.2, chloride 110, bicarb 18, BUN 76, creatinine 2.81, glucose 102,  calcium 9.2, ALT 30, AST 23, alkaline phosphatase 106, total bilirubin 0.4, albumin 3.7. Troponin less than 0.02. Wound cultures from 12/09/2011 are growing MRSA sensitive to gentamicin, Vanc, linezolid, tigecycline, and Bactrim. Prior culture growing Morganella morganii and Escherichia coli which are all sensitive to ciprofloxacin. Urinalysis negative for any infection at this time.   ASSESSMENT AND PLAN: This is an 81 year old male with history of CVA with left-sided hemiparesis, severe peripheral vascular disease with recent left foot second toe amputation for gangrene who was on Bactrim and Cipro as an outpatient admitted for altered mental status and acute renal failure.  1. Metabolic/uremic encephalopathy after dose of Zyprexa secondary to acute renal failure and accumulation of medication in the body, slowly improving with IV fluids at this time. Continue to hold medications like Zyprexa and oxycodone and renally dose all medications. Neuro checks as needed.  2. Acute renal failure, prerenal and ATN, likely dehydrated from diarrhea from the antibiotics and also was on Bactrim which could cause renal failure and hyperkalemia. Discontinue Bactrim. Hold benazepril for now. Renally dose all medications. Will get renal ultrasound and Nephrology consult for his renal failure just in case he needs any angiogram or other procedure. Will also continue IV fluids at this time.  3. Hyperkalemia from acute renal failure, on Bactrim. Continue to hold Bactrim. IV fluids and kayexalate, IV insulin and  dextrose dose once. No EKG changes. Repeat level has been ordered for this afternoon.  4. Severe peripheral vascular disease with recent left foot second toe gangrene amputation. Cultures growing Morganella, Escherichia coli sensitive to ciprofloxacin so will continue that. Will renal dose. Cultures are also growing MRSA and was on Bactrim as an outpatient but Bactrim is held due to renal failure and hyperkalemia. Start  linezolid for the same. Will get Podiatry and Vascular consults at this time.  5. History of CVA with left-sided hemiparesis, appears to be at baseline. Continue Aggrenox.   6. Hypertension. Continue Norvasc. Hold benazepril secondary to renal issues.  7. Diabetes mellitus. Hold glimepiride diet as the patient has poor p.o. intake. Follow HbA1c and place him on sliding scale insulin.  8. GI prophylaxis with omeprazole. 9. DVT prophylaxis with sub-Q heparin.   CODE STATUS: FULL CODE.        TIME SPENT ON ADMISSION: 50 minutes.   ____________________________ Enid Baas, MD rk:drc D: 12/26/2011 07:15:57 ET T: 12/26/2011 08:19:26 ET JOB#: [id number]  cc: Enid Baas, MD, <Dictator> [facility] Linus Galas, [place] Annice Needy, MD Enid Baas MD ELECTRONICALLY SIGNED 12/27/2011 11:27

## 2014-06-21 NOTE — Discharge Summary (Signed)
PATIENT NAME:  Melvin Obrien, Melvin Obrien MR#:  [id number] DATE OF BIRTH:  07/25/1924  DATE OF ADMISSION:  12/26/2011 DATE OF DISCHARGE:  12/30/2011  PRIMARY CARE PHYSICIAN: Toy CookeyErnest Eason, MD  CONSULTANTS:  1. Mosetta PigeonHarmeet Singh, MD - Nephrology. 2. Festus BarrenJason Dew, MD - Vascular Surgery. 3. Barnetta ChapelMartin Skulskie, MD - Gastroenterology.   DISCHARGE DIAGNOSES:  1. Metabolic/uremic encephalopathy.  2. Acute renal failure.  3. Hyperkalemia.  4. Severe peripheral vascular disease.  5. History of methicillin-resistant Staphylococcus aureus.  6. Anemia.  7. Hypertension.  8. Diabetes.  9. History of cerebrovascular accident.   CONDITION: Stable.   CODE STATUS: FULL CODE.  HOME MEDICATIONS:  1. Multivitamin 1 tablet p.o. daily.  2. Colace 100 mg p.o. twice a day. 3. Glimepiride 1 mg p.o. daily.  4. Vitamin D3 1000 international units p.o. tablets 1 p.o. daily. 5. Tylenol 325 mg p.o. tablets 1 to 2 tablets every four hours p.r.n.  6. Amlodipine/benazepril 5 mg/20 mg p.o. daily.  7. Travatan Z 0.004% ophthalmic solution one drop to each affected eye once a day in the evening. 8. Chlorpromazine 10 mg p.o. every eight hours.  9. MiraLax p.o. powder for reconstitution 17 grams once a day.  10. Omeprazole 20 mg p.o. twice a day.  11. Robitussin-DM max 1 teaspoon every 4 to 6 hours p.r.n. for cough.  12. Acetaminophen/oxycodone 325 mg/5 mg p.o. tablet every four hours p.r.n.  13. Flomax 0.4 mg p.o. once a day after meal.  14. Nystatin 100,000 units/g topical cream apply topically to affected area every 12 hours p.r.n.  MEDICATIONS TO STOP: Aggrenox 25 mg/200 mg p.o. capsule extended-release one cap twice a day.   DISCHARGE INSTRUCTIONS: The patient is to resume home health.  DIET: Low sodium, ADA diet.   ACTIVITY: As tolerated.   FOLLOW-UP CARE: Follow-up with PCP within 1 to 2 weeks. Follow-up with Dr. Wyn Quakerew for possible surgery for foot gangrene. Followup with Dr. Marva PandaSkulskie for anemia and positive stool  occult. The patient may need follow-up hemoglobin.   REASON FOR ADMISSION: Confusion.   HOSPITAL COURSE: The patient is an 81 year old African American male with a history of cerebrovascular accident with left hemiparesis, bedbound, blind in the left eye, hypertension, diabetes, cerebrovascular accident, severe peripheral vascular disease, and recently got left second toe gangrene secondary to peripheral vascular disease and then had amputation performed. The patient has been taking Cipro and Bactrim after discharge from the hospital. According to patient's fiancee, she gave the patient Zyprexa and could not wake him up so she brought the patient to the ED for further evaluation. The patient was noted to have increased creatinine of 2.81, potassium was 6.2 and BUN was 76. So the patient was admitted for metabolic/uremic encephalopathy and acute renal failure possibly due to dehydration from diarrhea or Bactrim. After admission the patient was treated with IV fluid support and Bactrim was on hold. In addition, benazepril was on hold due to acute renal failure and hyperkalemia. The patient was treated with Kayexalate and IV insulin and dextrose for hyperkalemia and potassium level decreased to normal range. After the above-mentioned treatment, the patient's renal function improved to normal range and the patient's mental status has been improving. He is alert and awake but demented.   For severe peripheral vascular disease with left foot gangrene, Dr. Wyn Quakerew evaluated the patient and suggested the patient may need BKA, but since his girlfriend does not want surgery at this time the patient will follow up with Dr. Wyn Quakerew as an outpatient for  possible surgery.   For anemia, the patient's hemoglobin decreased to 8.0 with positive stool occult so we held Aggrenox and requested a Gastroenterology consultation. Dr. Marva Panda suggested pylori testing and stool studies and continue PPI. The patient's hemoglobin increased to  8.3 then 9.6 today. He has no active bleeding.  For diarrhea, the patient came in with diarrhea but after admission C. difficile test is negative and the patient's diarrhea has resolved.   For severe peripheral vascular disease, the patient has been treated with Cipro and Zyvox due to history of MRSA during a hospitalization and the patient has no fever and no signs of obvious infection so we will stop antibiotics and he will follow up with Dr. Wyn Quaker as an outpatient.   For diabetes, the patient has been treated with sliding scale.  Since the patient's symptoms have much improved and renal function became normal and anemia is stable, the patient will be discharged to home today. His fiancee does not want to have surgery at this time. She agreed to the discharge plan. I discussed the patient's discharge plan with his fiancee, daughter, and case Production designer, theatre/television/film.   TIME SPENT: About 41 minutes.  ____________________________ Shaune Pollack, MD qc:slb D: 12/30/2011 15:43:37 ET T: 12/31/2011 11:53:00 ET JOB#: [id number]  cc: Shaune Pollack, MD, <Dictator> Serita Sheller. Maryellen Pile, MD Shaune Pollack MD ELECTRONICALLY SIGNED 01/01/2012 14:17

## 2014-06-21 NOTE — Consult Note (Signed)
Chief Complaint:   Subjective/Chief Complaint seen for anemia, hemne positive stool.  Patient denies abdominal pain, nausea or vomiting.  loose stool today times one.   VITAL SIGNS/ANCILLARY NOTES: **Vital Signs.:   27-Oct-13 07:55   Vital Signs Type Routine   Temperature Temperature (F) 98.4   Celsius 36.8   Temperature Source axillary   Pulse Pulse 63   Respirations Respirations 17   Systolic BP Systolic BP 135   Diastolic BP (mmHg) Diastolic BP (mmHg) 75   Mean BP 95   Pulse Ox % Pulse Ox % 98   Pulse Ox Activity Level  At rest   Oxygen Delivery Room Air/ 21 %    13:49   Vital Signs Type Routine   Temperature Temperature (F) 97.4   Celsius 36.3   Temperature Source AdultAxillary   Pulse Pulse 76   Respirations Respirations 18   Systolic BP Systolic BP 123   Diastolic BP (mmHg) Diastolic BP (mmHg) 68   Mean BP 86   Pulse Ox % Pulse Ox % 99   Pulse Ox Activity Level  At rest   Oxygen Delivery Room Air/ 21 %  *Intake and Output.:   27-Oct-13 06:54   Stool  small loose stool    13:14   Stool  med loose brown stool   Brief Assessment:   Cardiac Regular    Respiratory clear BS    Gastrointestinal details normal Soft  Nontender  Nondistended  No masses palpable  Bowel sounds normal   Lab Results: Routine Hem:  24-Oct-13 04:22    Hemoglobin (CBC)  9.2  25-Oct-13 01:48    Hemoglobin (CBC)  8.4  26-Oct-13 05:58    Hemoglobin (CBC)  8.0  27-Oct-13 12:44    Hemoglobin (CBC)  8.3 (Result(s) reported on 29 Dec 2011 at 01:07PM.)   Assessment/Plan:  Assessment/Plan:   Assessment 1) anemia, heme positive stool, note previously on aggranox-multifactorial with acd. (ncnc) stab;e over the past 3 days. 2) diarrhea-improving.    Plan 1) awaiting h. pylori testing and stool studies, continue ppi.  following.   Electronic Signatures: Barnetta ChapelSkulskie, Mkenzie Dotts (MD)  (Signed 27-Oct-13 16:52)  Authored: Chief Complaint, VITAL SIGNS/ANCILLARY NOTES, Brief Assessment, Lab Results,  Assessment/Plan   Last Updated: 27-Oct-13 16:52 by Barnetta ChapelSkulskie, Zalyn Amend (MD)

## 2014-06-21 NOTE — Consult Note (Signed)
PATIENT NAME:  Melvin Obrien, Melvin Obrien MR#:  [id number] DATE OF BIRTH:  06-11-1924  DATE OF CONSULTATION:  12/26/2011  REFERRING PHYSICIAN:   CONSULTING PHYSICIAN:  Fines Kimberlin A. Ether Griffins, DPM  REASON FOR CONSULTATION: Bilateral gangrene lower extremities.   HISTORY OF PRESENT ILLNESS: The patient is an 81 year old African American male who has been seen by Podiatry, Dr. Linus Galas, as well as Dr. Wyn Quaker for severe worsening peripheral vascular disease. He has undergone an attempted amputation of the second toe on his left foot that developed worsening gangrene. He has also had gangrene with osteomyelitis on his right great toe. He was scheduled to undergo what sounds to be further work-up and amputation of his left leg next week as well as amputation of his right great toe next week. He has been admitted with acute renal failure.   PAST MEDICAL HISTORY:  1. History of CVA with left-sided hemiparesis. 2. Hypertension. 3. Gastroesophageal reflux disease. 4. Diabetes. 5. Blindness left eye. 6. Severe peripheral vascular disease.  MEDICATIONS:  1. Acetaminophen.  2. Aggrenox.  3. Amlodipine. 4. Benazepril.  5. Chlorpromazine.   6. Colace. 7. Glimepiride. 8. MiraLAX. 9. Multivitamin. 10. Prilosec.   11. Oxycodone. 12. Robitussin. 13. Travatan. 14. Vitamin D. 15. Cipro. 16. Bactrim.   ALLERGIES: No known drug allergies.   PAST SURGICAL HISTORY: 1. Cholecystectomy. 2. Angioplasties. 3. Left second toe amputation.   SOCIAL HISTORY: Lives at home with his girlfriend. Currently does not smoke.   REVIEW OF SYSTEMS: He is in flexion contracture. He does not ambulate. He is bedbound. Left-sided blindness. He does not complain of shortness of breath currently.   PHYSICAL EXAMINATION:   GENERAL: He is alert and oriented.   VASCULAR: He has nonpalpable dorsalis pedis or posterior tibial pulses. Capillary fill time is markedly delayed.   NEUROLOGIC: He seems to have some lower extremity  neuropathy to both lower extremities though he has some sensation intact from the mid foot proximal.   DERMATOLOGIC: On his right foot he has a noted large ulceration on the tip of his right great toe with bone exposure. There are some dry gangrenous changes overlying this area. On his left foot he has gangrene at the second toe amputation site that previously had occurred. He has lateral midfoot gangrenous changes with pregangrenous changes on the dorsal left foot.   MUSCULOSKELETAL: He is in flexion contracture. He has noted decreased range of motion of his ankle and his toes are markedly contracted as well.   ASSESSMENT: Severe peripheral vascular disease bilateral lower extremities with gangrenous changes.   PLAN: The patient was apparently scheduled for below-the-knee and possible above-the-knee amputation on his left side as well as right great toe amputation. He has been admitted prior to having this scheduled. I have discussed with Dr. Gilda Crease limb salvage, though I don't believe there is any limb salvage that can be offered as he has undergone multiple angioplasties and he has been evaluated by Podiatry and Vascular Surgery and found to be nonsalvageable. Will defer further surgical options to vascular at this time. Will just have dry dressings on his lower extremities currently. No further podiatric evaluation needed for now unless there is some future infection or issues that go on as he is being followed by Vascular Surgery currently. I will be glad to follow-up at any point in the future though.   ____________________________ Argentina Donovan. Ether Griffins, DPM jaf:drc D: 12/26/2011 18:50:26 ET T: 12/27/2011 08:42:43 ET JOB#: [id number]  cc: Jill Alexanders A. Ether Griffins, DPM, <Dictator> Tahnee Cifuentes  Augusten Lipkin DPM ELECTRONICALLY SIGNED 12/27/2011 14:49

## 2014-06-21 NOTE — Op Note (Signed)
PATIENT NAME:  Melvin Obrien, Melvin Obrien MR#:  [id number] DATE OF BIRTH:  [birth year]  DATE OF PROCEDURE:  11/18/2011  PREOPERATIVE DIAGNOSES:  1. Peripheral artery disease with ulceration, left lower extremity.  2. Stroke.  3. Status post right lower extremity revascularization for limb salvage.  4. Hypertension.   POSTOPERATIVE DIAGNOSES:  1. Peripheral artery disease with ulceration, left lower extremity.  2. Stroke.  3. Status post right lower extremity revascularization for limb salvage.  4. Hypertension.   PROCEDURES PERFORMED: 1. Catheter placement into left peroneal artery from right femoral approach.  2. Left lower extremity angiogram.  3. Percutaneous transluminal angioplasty of peroneal artery with 3 mm diameter angioplasty balloon.  4. Percutaneous transluminal angioplasty of tibioperoneal trunk with 3 mm diameter conventional angioplasty and AngioScore angioplasty balloon.  5. Percutaneous transluminal angioplasty of below-knee popliteal artery 4 mm diameter AngioScore balloon.  6. Percutaneous transluminal angioplasty of proximal SFA with 6 mm diameter angioplasty balloon.  7. StarClose closure device, right femoral artery.   SURGEON: Annice NeedyJason S. Dew, M.D.   ANESTHESIA: Local with moderate conscious sedation.   ESTIMATED BLOOD LOSS: 25 mL.   INDICATION FOR PROCEDURE: This is an 81 year old African American male with an ulceration of the left lower extremity and markedly reduced perfusion. He has undergone two previous right lower extremity revascularizations for limb salvage as well. Angiogram is indicated to try to improve perfusion for limb salvage.   DESCRIPTION OF PROCEDURE: The patient was brought to the vascular interventional radiology suite. The groins were shaved and prepped and a sterile surgical field was created. The right femoral head was localized with fluoroscopy. The right femoral artery was accessed without difficulty with a Seldinger needle. As we have previously  done two aortograms this year, I did not repeat the aortogram and just crossed the aortic bifurcation with a rim catheter. Selective left lower extremity angiogram was then performed. This showed an approximately 80% stenosis of the proximal SFA. The remainder of the SFA was reasonably normal until some mild disease distally. The popliteal artery below the knee had two areas of greater than 50% stenosis. There was an occlusion of the tibioperoneal trunk into the proximal peroneal artery which was the best runoff distally. The anterior tibial artery occluded after several centimeters. The patient was systemically heparinized. I was able to cross all lesions and confirm intraluminal flow in the peroneal artery with a mild amount of difficulty. I then replaced it with an 0.014 wire. A 3 mm diameter angioplasty balloon was inflated in the peroneal artery up to the TP trunk. A 4 mm diameter AngioScore angioplasty balloon was inflated in the tibioperoneal trunk and the below-knee popliteal artery. Following these inflations, there was excellent flow through these areas with no significant dissection or residual stenosis with maintained runoff distally. The proximal lesion was treated with a 6 mm diameter AngioScore balloon with less than 20% residual stenosis after angioplasty. That this time, I elected to terminate the procedure. The sheath was pulled back to the ipsilateral external iliac artery and oblique arteriogram was performed and StarClose closure device was deployed in the usual fashion with excellent hemostatic result. The patient tolerated the procedure well and was taken to the recovery room in stable condition.  ____________________________ Annice NeedyJason S. Dew, MD jsd:slb D: 11/18/2011 14:41:02 ET T: 11/18/2011 15:41:18 ET JOB#: [id number]  cc: Annice NeedyJason S. Dew, MD, <Dictator> Annice NeedyJASON S DEW MD ELECTRONICALLY SIGNED 11/21/2011 8:17

## 2014-06-21 NOTE — Consult Note (Signed)
PATIENT NAME:  Melvin Obrien, Melvin Obrien MR#:  [id number] DATE OF BIRTH:  Oct 07, 1924  DATE OF CONSULTATION:  01/14/2012  REFERRING PHYSICIAN:  Dr. Wyn Quaker  CONSULTING PHYSICIAN:  Darrick Meigs, MD  PRIMARY CARE PHYSICIAN: [facility]    REASON FOR CONSULTATION: Management of multiple medical problems, acute renal failure.   HISTORY OF PRESENT ILLNESS: The patient is an 81 year old male with multiple medical problem including CVA with left hemiparesis and contractures, PVD with multiple angioplasties, left second toe gangrene, hypertension, and diabetes who was last admitted to [facility] in October 2013 for altered mental status due to Zyprexa. At that time he was also found to have acute renal failure which was felt to be due to a combination of renal failure and the use of Bactrim and ACE inhibitors. The patient has a gangrenous left lower extremity and was recommended left AKA by Dr. Wyn Quaker at that time, however, the patient's family wanted to take him home and stabilize him before bringing him back for the procedure. He is currently admitted to the Vascular Surgery service and left AKA is planned for 01/16/2012. The patient has multiple medical problems and was found to be in acute renal failure, therefore, Medicine was consulted. The patient's creatinine currently is 3.30. It was as high as 2.81 in October and had normalized. His renal ultrasound done in October was normal. The patient also had guaiac-positive stool and had a steady decline in his hemoglobin and hematocrit. He was evaluated by Dr. Marva Panda during that hospitalization and recommended to discontinue taking the Aggrenox which the patient's wife reports is not taking any longer. His hemoglobin and hematocrit currently is 8.8. He was discharged with a hemoglobin of 9.8.   ALLERGIES: No known drug allergies.  HOME MEDICATIONS:  1. The patient is no longer taking the Bactrim, Keflex, or Aggrenox.  2. Tylenol 650 mg q.4 to 6 hours p.r.n.   3. Amlodipine 5 mg daily.  4. Chlorpromazine 10 mg q.8 hours. 5. Colace 100 mg 1 to 2 tablets as needed.  6. Glimepiride 1 mg daily.  7. MiraLAX 17 grams daily.  8. Multivitamin 1 tablet daily.  9. Omeprazole 20 mg twice a day. 10. Oxycodone/Tylenol 5/325 one q.4 hours p.r.n.  11. Robitussin p.r.n.  12. Travatan eyedrops one drop right eye daily.  13. Vitamin D3 1000 international units daily.   PAST MEDICAL HISTORY:  1. CVA with left hemiparesis and left-sided contractures. The patient is mostly bed/wheelchair bound.  2. Hypertension.  3. Gastroesophageal reflux disease.  4. Diabetes.  5. Legally blind in the left eye. 6. Severe PVD with multiple angioplasties. 7. Left second toe gangrene status post amputation. 8. History of MRSA infection.  9. Anemia of chronic disease with guaiac-positive stools. 10. Admission in October for metabolic/uremic encephalopathy, acute renal failure, and hyperkalemia.   PAST SURGICAL HISTORY:  1. Left second toe gangrene status post amputation. 2. Cholecystectomy.  3. Multiple angioplasties of lower extremities for PVD.   SOCIAL HISTORY: Currently lives at home with his wife who is his power-of-attorney. Quit smoking more than two years ago. There is no history of alcohol or drug abuse.   FAMILY HISTORY: The patient does not remember if his parents had any medical problems.   REVIEW OF SYSTEMS: The patient has some dysarthria and it is difficult to communicate with him. CONSTITUTIONAL: Denies any fatigue. EYES: Denies any blurred or double vision. The patient has left eye blindness with corneal opacity. ENT: Denies any tinnitus or ear pain. RESPIRATORY: Denies  any cough, wheezing. CARDIOVASCULAR: Denies any chest pain, palpitations. GI: Denies any nausea, vomiting, diarrhea, abdominal pain. GU: Denies any dysuria or hematuria. He is incontinent of urine. ENDOCRINE: Denies any polyuria or nocturia. HEME/LYMPH: Has history of anemia. SKIN: The patient  has gangrene in his left foot. He also has ulcers on both his feet. MUSCULOSKELETAL: Denies any swelling, gout. NEUROLOGICAL: Has history of CVA with left-sided hemiparesis. PSYCH: Denies any anxiety or depression.   PHYSICAL EXAMINATION:   VITAL SIGNS: Temperature 97.8, heart rate 98, respiratory rate 16, blood pressure 113/67, pulse oximetry 100% on room air.   GENERAL: The patient is an elderly African American male, chronically ill-appearing, laying in bed not in acute distress.   HEAD: Atraumatic, normocephalic.   EYES: There is pallor. The patient has left eye corneal opacity. No icterus or cyanosis.   ENT: Dry mucous membranes. No oropharyngeal erythema or thrush.   NECK: Supple. No masses. No JVD. No thyromegaly.   CHEST WALL: No tenderness to palpation. Not using accessory muscles of respiration. No intercostal muscle retraction.   LUNGS: Bilateral basal crepitation. No wheezing or rhonchi.   CARDIOVASCULAR: S1, S2 regular. There are no murmurs, rubs, or gallops.   ABDOMEN: Soft. No guarding. No rigidity. No organomegaly. Nondistended. Nontender. Normal bowel sounds.   SKIN: The patient has ulcers on both his feet which are covered by dressing.   PERIPHERIES: No pedal edema. The patient has dressing on both his feet.   NEUROLOGIC: Left-sided hemiparesis with contractures.   PSYCH: Alert, oriented to self and place. Normal affect.   RESULTS: White count 7.8, hemoglobin 8.8, hematocrit 26.6, platelet count 485, glucose 39, BUN 68, creatinine 3.30, sodium 138, potassium 6.3, chloride 110, CO2 22, calcium 8.9.   ASSESSMENT AND PLAN: This is an 81 year old male with multiple medical problems. Medicine is consulted for management of medical problems.  1. Acute renal failure. The patient had presented with a creatinine of 2.81 in October and that was felt to be due to a combination of prerenal factors since the patient was having diarrhea and the use of Bactrim and ACE  inhibitors. Nephrology was consulted at that time and his renal ultrasound was normal. His creatinine had normalized with IV fluids. The patient's wife denies any diarrhea but reports that for the last 3 to 4 days his oral intake has been very poor which she attributed to pain in his left leg due to the gangrene. Will hydrate the patient with IV fluids. Will place a Foley since the patient is incontinent and strict I and O monitoring is required. Will discontinue his ACE inhibitor and avoid any nephrotoxic medications. Monitor strict I's and O's and avoid hypotension.  2. Hyperkalemia possibly due to acute renal failure and use of ACE inhibitor. Will discontinue the ACE inhibitor. Monitor on telemetry. Will give one ampule of calcium gluconate, one ampule of bicarbonate, 10 units of IV regular insulin, and one ampule of dextrose. Will also give 30 grams of p.o. Kayexalate. Will recheck potassium in a.m.   3. History of diabetes. The patient is hypoglycemic. His oral intake has not been very good for the last few days. Will discontinue his oral hypoglycemics. Place him on insulin sliding scale.  4. Anemia. The patient was evaluated by Gastroenterology during his last admission since he was found to be guaiac positive. No luminal evaluation was done. His Aggrenox has been placed on hold. His hemoglobin is 9.8 at the time of discharge, currently 8.8. Expect some fall in hemoglobin  and hematocrit due to hemodilution due to fluids. Continue to hold Aggrenox. 5. History of CVA with left-sided hemiparesis. The patient has contractures on the left side.  6. Hypertension. Blood pressure is well controlled at present. Will discontinue his ACE inhibitor. Continue low dose Norvasc. Avoid hypotension in view of acute renal failure. 7. History of GERD. Will continue PPI therapy.  8. 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 Will continue eyedrops.  9. Left foot gangrene. The patient is scheduled for  elective left BKA on 01/16/2012. Given his age and other risk factors, he is at moderate to high risk for any surgical procedure.   Reviewed all medical records, discussed with the patient and his wife.      TIME SPENT: 75 minutes.   ____________________________ Darrick MeigsSangeeta Holliday Sheaffer, MD sp:drc D: 01/14/2012 18:33:58 ET T: 01/15/2012 08:17:18 ET JOB#: [id number]  cc: Darrick MeigsSangeeta Janessa Mickle, MD, <Dictator> Darrick MeigsSANGEETA Latyra Jaye MD ELECTRONICALLY SIGNED 01/19/2012 14:21

## 2014-06-24 NOTE — Op Note (Signed)
PATIENT NAME:  Melvin Obrien, Melvin Obrien MR#:  [id number] DATE OF BIRTH:  02/08/1925  DATE OF PROCEDURE:  07/29/2012  PREOPERATIVE DIAGNOSIS: Gangrene, right foot, status post 1st, 2nd and 3rd toe amputations.   POSTOPERATIVE DIAGNOSIS: Gangrene, right foot, status post 1st, 2nd and 3rd toe amputations.   PROCEDURE:  Amputations of 4th and 5th rays, essentially creating a transmetatarsal amputation.  SURGEON: Annice NeedyJason S Dew, MD   ANESTHESIA: MAC.   ESTIMATED BLOOD LOSS: Minimal.   INDICATION FOR PROCEDURE: An 81 year old African American male with severe comorbidities including peripheral vascular disease, diabetes multiple other ongoing issues who has already had toes 1, 2 and 3 removed for gangrenous changes. His fourth toe was has developed gangrenous changes, and he has an ulcer at the lateral portion of the fifth metatarsal head. We will do 4th and 5th ray amputation essentially completing a transmetatarsal amputation on his foot. He has recently undergone an angiogram and was found to have peroneal runoff dominant to the foot, and we are still attempting limb salvage on him.   DESCRIPTION OF PROCEDURE: The patient is brought to the operative suite, and after an adequate level of intravenous sedation was obtained, the right foot was sterilely prepped and draped and a sterile surgical field was created. A curvilinear incision was created around toes 4 and 5.  I encompassed some of the previous incision from the 3rd toe amputation as well which had fibrinous exudate and had not healed. We went down and took the metatarsals below the incision line a centimeter or 2 with bone cutters. This was metatarsals 4 and 5, and I used the rongeurs to clean up metatarsal 3 some as well. The wound was then irrigated. Vicryl sutures were used to approximate the subcutaneous tissues, and a series of interrupted nylon mattress sutures were used to close the skin. Sterile dressing was then placed.    The patient tolerated  the procedure well and was taken to the recovery room in stable condition.     ____________________________ Annice NeedyJason S. Dew, MD jsd:cb D: 07/29/2012 13:40:51 ET T: 07/29/2012 14:24:11 ET JOB#: [id number]  cc: Annice NeedyJason S. Dew, MD, <Dictator> Annice NeedyJASON S DEW MD ELECTRONICALLY SIGNED 08/12/2012 10:53

## 2014-06-24 NOTE — Consult Note (Signed)
PATIENT NAME:  Melvin Obrien, Melvin Obrien MR#:  [id number] DATE OF BIRTH:  Feb 18, 1925  DATE OF CONSULTATION:  08/04/2012  REFERRING PHYSICIAN:   CONSULTING PHYSICIAN:  Rhona RaiderMatthew G. Jennilee Demarco, DPM  REASON FOR CONSULTATION: Right foot abscess and wound secondary to transmetatarsal amputation with methicillin-resistant staphylococcus aureus positive cultures.   HISTORY OF PRESENT ILLNESS: The patient underwent a transmetatarsal amputation by Dr. Wyn Quakerew, I think it was sometime last week. He was discharged home. He became feverish at the end of last week. His wife brought him to the Emergency Room and he was admitted.   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   PAST SURGICAL HISTORY: Left AKA, cholecystectomy, multiple angioplasties and right transmetatarsal amputation.   ALLERGIES: NKDA.  HOME MEDICATIONS: Include Tylenol, Travatan, hydrocodone, omeprazole, glimepiride, gabapentin, aspirin and amlodipine.   SOCIAL HISTORY: Quit smoking 2 to 3 years ago. Denies EtOH or drug use. Lives with his wife. He is 81 years old. He is really non-communicative. I cannot really get a whole lot of information out of him.   PHYSICAL EXAMINATION:  Vital signs include, at present, temperature 98.4, pulse 69, respirations and her blood pressure 108/75, pulse oximetry 97%. Lower extremity exam shows status post right transmetatarsal amputation by Dr. Wyn Quakerew. Left a.k.a. The flap overall looks fairly stable. There are necrotic or gangrenous changes to the region. There is minimal redness. It does appear soupy along the incision margin, particularly centrally.   An MRI showed some edema in the second metatarsal stump, but  it will be a little bit difficult to determine whether this was present due to osteomyelitis or possibly just due to the most recent surgery that he has had. I will go ahead and redress here today. I will discuss this with Dr. Wyn Quakerew and Dr. Gilda CreaseSchnier. My recommendation is that Dr. Wyn Quakerew and Dr. Gilda CreaseSchnier follow on this and decide what direction they want to go with it. I will discuss with them my findings and will go from there.   ____________________________ Rhona RaiderMatthew G. Rondey Fallen, DPM mgt:aw D: 08/04/2012 12:13:22 ET T: 08/04/2012 12:29:08 ET JOB#: [id number]  cc: Rhona RaiderMatthew G. Danamarie Minami, DPM, <Dictator> Epimenio SarinMATTHEW G Lekita Kerekes MD ELECTRONICALLY SIGNED 08/26/2012 15:28

## 2014-06-24 NOTE — Op Note (Signed)
PATIENT NAME:  Melvin ProfferROGERS, [name] MR#:  [id number] DATE OF BIRTH:  02-23-1925  DATE OF PROCEDURE:  03/12/2012  PREOPERATIVE DIAGNOSES:  1.  Infection right foot at right great toe amputation site.  2.  Stroke.  3.  Diabetes mellitus.  4.  Peripheral vascular disease.   POSTOPERATIVE DIAGNOSES: 1.  Infection right foot at right great toe amputation site. 2.  Stroke. 3.  Diabetes mellitus. 4.  Peripheral vascular disease.  PROCEDURE PERFORMED:  Irrigation and debridement of skin, soft tissue, muscle and bone, right foot with the VAC dressing placement less than 25 sq cm.   SURGEON: Annice NeedyJason S. Dew, M.D.    ANESTHESIA: MAC.   ESTIMATED BLOOD LOSS: 25 mL.   INDICATION FOR PROCEDURE: An 81 year old African American male with right foot wound. He had a right great toe amputation a couple of months ago. This had purulent drainage, has an infection and needs debridement.   PROCEDURE IN DETAIL:  The patient was brought to the operative suite and after an adequate level of intravenous sedation was obtained, the right foot was sterily prepped and draped, a sterile surgical field was created.  A curvilinear incision was created around the previous incision after a local anesthesia was instilled with 0.25% Marcaine and 1% lidocaine.  There was a significant amount of nonviable tissue and the skin, soft tissue and muscle was all debrided with Mayo scissors. There was then exposed bone that we debrided back with rongeurs. The wound was irrigated with 1 liter of pulse lavage saline and then a small vac sponge was cut to fit the wound.  Strips of Ioban were used to be an occlusive seal. This was done and the patient was then awakened from anesthesia and taken to the recovery room in stable condition having tolerated the procedure well,     ____________________________ Annice NeedyJason S. Dew, MD jsd:eg D: 03/12/2012 15:46:02 ET T: 03/12/2012 19:38:12 ET JOB#: [id number]  cc: Annice NeedyJason S. Dew, MD, <Dictator> Annice NeedyJASON S DEW  MD ELECTRONICALLY SIGNED 03/13/2012 8:33

## 2014-06-24 NOTE — Consult Note (Signed)
PATIENT NAME:  BELA, NYBORG MR#:  [id number] DATE OF BIRTH:  March 21, 1924  DATE OF CONSULTATION:  07/31/2012  REFERRING PHYSICIAN:  Krystal Eaton, MD CONSULTING PHYSICIAN:  Rosalyn Gess. Latif Nazareno, MD  REASON FOR CONSULTATION: Staph aureus bacteremia.   HISTORY OF PRESENT ILLNESS: The patient is an 81 year old man with a past history significant for stroke, diabetes, peripheral vascular disease, status post left BKA and multiple amputations of his toes on the right, who was admitted with fever 1 day after having 4th and 5th ray amputation on 07/29/2012. The patient was apparently had been discharged and developed a fever of 103. He was brought back to the Emergency Room and was admitted to the hospital. His blood cultures are growing Staphylococcus aureus. He is currently on vancomycin and Zosyn. The patient is unable to provide any history due to his underlying stroke and dementia.   ALLERGIES: None.   PAST MEDICAL HISTORY: 1.  Diabetes.  2.  Stroke with left-sided hemiparesis.  3.  Hypertension.  4.  GERD.  5.  Peripheral vascular disease, status post left AKA and multiple right toe amputations, most recently on the day before admission.  6.  Anemia.  7.  Dementia.  8.  Status post cholecystectomy.   SOCIAL HISTORY: The patient lives with his wife. He is a prior smoker. He does not drink. No injecting drug use history.   FAMILY HISTORY: Unable to obtain from the patient.   REVIEW OF SYSTEMS: Unable to obtain from the patient.   PHYSICAL EXAMINATION: VITAL SIGNS: T-max of 99.1, T-current of 97.6, pulse 77, blood pressure 136/75, 97% on room air.  GENERAL: An 81 year old black man in no acute distress, markedly confused and lethargic.  HEENT: Normocephalic, atraumatic. The left eye had opaque pupil, unable to assess extraocular motion. Sclerae, conjunctivae, and lids are without evidence for emboli or petechiae. Oropharynx showed no obvious erythema or exudate. Gums are in fair  condition.  NECK: Midline trachea. No lymphadenopathy. No thyromegaly.  LUNGS: Clear to auscultation bilaterally with good air movement. No focal consolidation.  CARDIAC: Regular rate and rhythm without murmur, rub or gallop.  ABDOMEN: Soft, nontender and nondistended. No hepatosplenomegaly, and no hernia is noted.  EXTREMITIES: He is status post left AKA. The stump appeared to be well healed. There is no redness or swelling or drainage from the surgical wound. The right foot had no edema. There was a recent surgical wound across the transmetatarsal area with sutures still in place. There was no significant redness. No drainage. No purulence coming from the wound. There was some serosanguineous drainage on the bandage, however.  SKIN: No rashes other than the surgical wounds described above. There were no stigmata of endocarditis specifically. No Janeway lesions or ulcers noted.  NEUROLOGIC: The patient was lethargic and obtunded. He would not follow commands. He was unable to provide any history. He had some flexure contractures in the upper extremities.  PSYCHIATRIC: Unable to assess.  LABORATORY DATA: BUN of 9, creatinine 0.81, bicarbonate 26, anion gap of 6. White count of 15.0 with a hemoglobin 8.5, platelet count of 471, ANC of 11.7. White count on admission was 17.3. Blood cultures are growing Staphylococcus aureus in 1 of 4 bottles from admission. Urinalysis is essentially unremarkable. A right lower extremity venous Doppler showed no evidence for DVT. Chest x-ray showed no cardiopulmonary disease.   IMPRESSION: An 81 year old male with a history of stroke, diabetes, peripheral vascular disease status post left above knee amputation, status post multiple toe amputations on  the right admitted with Staphylococcus aureus bacteremia.   RECOMMENDATIONS: 1.  The most likely source for his bacteremia is his foot, but his wound looks fairly good. We will need to work him up for endocarditis.  2.  We  will get plain films of the right foot to look for any signs of osteomyelitis.  3.  We will get a transthoracic echocardiogram. Given his overall poor condition, I would not pursue TEE.  4.  We will continue vancomycin until his sensitivities return.  5.  We will discontinue Zosyn tomorrow if the x-rays of his foot look okay.  6.  Would consider vascular surgery consult to assess his wounds.   This is a high-level infectious disease consult.   Thank you very much for involving me in [name] care.  ____________________________ Rosalyn GessMichael E. Jahid Weida, MD meb:cs D: 07/31/2012 14:46:02 ET T: 07/31/2012 15:50:06 ET JOB#: [id number]  cc: Rosalyn GessMichael E. Shakeya Kerkman, MD, <Dictator> Jahzeel Poythress E Ramiyah Mcclenahan MD ELECTRONICALLY SIGNED 08/04/2012 11:43

## 2014-06-24 NOTE — Op Note (Signed)
PATIENT NAME:  Melvin Obrien, Melvin Obrien MR#:  [id number] DATE OF BIRTH:  Apr 05, 1924  DATE OF OPERATION:  07/16/2012  PREOPERATIVE DIAGNOSES: 1.  Gangrene, right third toe.  2.  Status post first and second toe amputation.  3.  Status post left above-the-knee amputation.  4.  Peripheral vascular disease, with gangrene.  5.  Stroke.   POSTOPERATIVE DIAGNOSES 1.  Gangrene, right third toe.  2.  Status post first and second toe amputation.  3.  Status post left above-the-knee amputation.  4.  Peripheral vascular disease, with gangrene.  5.  Stroke.   PROCEDURE: Right third toe ray amputation.   SURGEON: Festus BarrenJason Micael Barb, M.D.   ANESTHESIA: MAC.   BLOOD LOSS: Minimal.   INDICATION FOR PROCEDURE: An 81 year old African American male well-known to me. He has a gangrenous third toe and is here for surgical resection as there is some purulence from the base.   Risks and benefits of the procedure were discussed and informed consent was obtained.   DESCRIPTION OF PROCEDURE: The patient was brought to the operative suite, and after an adequate level of general anesthesia, intravenous sedation was obtained. The foot was sterilely prepped and draped, and a sterile surgical field was created. A digital block was created with approximately 28 mL of a mixture of bupivacaine and lidocaine. I then made a curvilinear incision around the gangrenous toe to viable-appearing skin. There was clearly a significant amount of nonviable tissue that we debrided back with electrocautery and Metzenbaum scissors.    The toe was taken. The metatarsal head was taken with bone cutters, and rongeurs were used to take all loose pieces. The wound was irrigated and was closed with a couple of 2-0 Vicryls and  a few 2-0 nylon mattress sutures loosely. A sterile dressing was placed and the patient tolerated the procedure well and was taken to the recovery room in stable condition.     ____________________________ Annice NeedyJason S. Elexius Minar,  MD jsd:dm D: 07/16/2012 14:30:07 ET T: 07/16/2012 15:32:47 ET JOB#: [id number]  cc: Annice NeedyJason S. Cesiah Westley, MD, <Dictator> Annice NeedyJASON S Angas Isabell MD ELECTRONICALLY SIGNED 07/20/2012 13:55

## 2014-06-24 NOTE — Consult Note (Signed)
Impression:  81yo male w/ h/o CVA, DM, PVD, s/p left ABKA, s/p multiple toe amputations on right admitted with Staph aureus bacteremia.     The most likely source for his bacteremia is his foot, but the wound looks fairly good.  Will need to work him up for endocarditis.   Will get plain films of right foot to look for any signs of osteomyelitis.   Would get TTE.  Given his overall poor condition, I would not pursue TEE.   Continue vanco until sensitivities return.   Will d/c zosyn tomorrow if xrays look ok.   Would consider vascular surgery consult to assess his wounds.    Electronic Signatures: Gail Creekmore MPH, Rosalyn GessMichael E (MD) (Signed on 30-May-14 14:38)  Authored   Last Updated: 30-May-14 14:41 by Ladarian Bonczek MPH, Rosalyn GessMichael E (MD)

## 2014-06-24 NOTE — Op Note (Signed)
PATIENT NAME:  Melvin Obrien, Melvin Obrien MR#:  [id number] DATE OF BIRTH:  [birth year]  DATE OF PROCEDURE:  05/04/2012  PREOPERATIVE DIAGNOSES:  1.  Peripheral arterial disease with nonhealing ulcer, right lower extremity.  2.  Status post right great toe amputation with open wound.  3.  Stroke.  4.  Diabetes.   POSTOPERATIVE DIAGNOSES:  1.  Peripheral arterial disease with nonhealing ulcer, right lower extremity.  2.  Status post right great toe amputation with open wound.  3.  Stroke.  4.  Diabetes.   PROCEDURES: 1.  Ultrasound guidance for vascular access, left femoral artery.  2.  Catheter placement into right peroneal artery from left femoral approach.  3.  Aortogram and selective right lower extremity angiogram.  4.  Percutaneous transluminal angioplasty of below-knee popliteal artery and tibioperoneal trunk with 4 mm diameter angioplasty balloon.  5.  Percutaneous transluminal angioplasty of peroneal artery with 2.5 mm diameter angioplasty balloon.  6.  StarClose closure device, left femoral artery.   SURGEON: Annice Needy, M.D.   ANESTHESIA: Local with moderate conscious sedation.   ESTIMATED BLOOD LOSS: 25 mL.  FLUOROSCOPY TIME: Approximately 6 minutes.  CONTRAST USED: 60 mL.   INDICATION FOR PROCEDURE: This is an 81 year old African American male with multiple ongoing issues. He is status post left above-knee amputation. He has a nonhealing wound of the right foot. He is status post right great toe amputation, but his wound has been slow to heal. He has had previous intervention to the right lower extremity and angiogram was performed for further evaluation of his right lower extremity perfusion for wound healing in hopes of limb salvage. The risks and benefits were discussed. Informed consent was obtained.   DESCRIPTION OF PROCEDURE: The patient is brought to the vascular interventional radiology suite. Groins were shaved and prepped and a sterile surgical field was created. Due to  previous access, he has poor anatomic landmarks. Ultrasound was used to visualize a patent left femoral artery. This was accessed under direct ultrasound guidance without difficulty with a Seldinger needle. A J-wire and 5-French sheath were placed. Pigtail catheter was placed into the aorta and AP aortogram was performed. This showed a calcified, but widely patent aorta and iliac system without flow-limiting stenosis. The renal arteries are patent bilaterally. I then hooked the aortic bifurcation and advanced to the right femoral head. Selective right lower extremity angiogram was then performed. This demonstrated some mild areas of narrowing in the superficial femoral artery, which were no greater than 30%. Profunda femoris artery is patent. The popliteal artery above the knee was patent and below the knee this tapered down right into the tibioperoneal trunk. There was an approximately 60 to 70% stenosis. The peroneal artery was the only runoff distally. There were several areas along the way with a calcified lesion in a small vessel that appeared to be moderate to severely stenotic. The patient was systemically heparinized.   A 6-French Ansell sheath was placed over a Terumo advantage wire. I was able to park the wire in the distal peroneal artery. A 4 mm diameter angioplasty balloon was inflated in the distal popliteal artery and tibioperoneal trunk and a 2.5 mm diameter angioplasty balloon was used to treat the peroneal artery in the mid to proximal segments. Wastes were taken in multiple areas that resolved with angioplasty. Completion angiogram showed significant improvement in the popliteal and tibioperoneal trunk lesion. The peroneal artery flow also seemed to be improved, although the calcification in the small vessel made it  a little difficult to determine if there might be some mild residual stenosis. The flow into the foot was through collaterals in the peroneal and appeared significantly better after  intervention and prior to intervention. At this point, I elected to terminate the procedure. The sheath was pulled back to the ipsilateral external iliac artery and oblique arteriogram was performed. StarClose closure device was deployed in the usual fashion with excellent hemostatic result. The patient tolerated the procedure well and was taken to the recovery room in stable condition.  ____________________________ Annice NeedyJason S. Dew, MD jsd:aw D: 05/04/2012 09:23:24 ET T: 05/04/2012 10:54:20 ET JOB#: [id number]  cc: Annice NeedyJason S. Dew, MD, <Dictator> Drue DunHenry F. Redmond Schoolripp, MD Annice NeedyJASON S DEW MD ELECTRONICALLY SIGNED 05/07/2012 9:42

## 2014-06-24 NOTE — Discharge Summary (Signed)
PATIENT NAME:  Melvin Obrien, Melvin Obrien MR#:  [id number] DATE OF BIRTH:  04/07/1924  DATE OF ADMISSION:  07/30/2012 DATE OF DISCHARGE:  08/11/2012   ADMITTING DIAGNOSIS: Fever.   DISCHARGE DIAGNOSES: 1.  Fever due to sepsis from methicillin-resistant Staphylococcus aureus bacteremia.  2.  Methicillin-resistant Staphylococcus aureus bacteremia, likely due to a foot infection with a wound, status post wound VAC placement in the right foot.  3.  Peripheral vascular disease.  4.  Dementia.  5.  Chronic anemia.  6.  History of CVA with  left-sided hemiparesis.  7.  Hypertension.  8.  Gastroesophageal reflux disease.  9.  Diabetes.  10.  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  11.  Left above-knee-amputation. 12.  History of multidrug-resistant organism infection in the past.  13.  Anemia of chronic disease as well as iron deficiency anemia.  14.  Status post cholecystectomy.  15.  Status post multiple angioplasties.  16.  Right transmetatarsal amputation.   CONSULTANTS:  1.  Dr. Leavy Cella. 2.  Dr. Levora Dredge. 3.  Dr. Orland Jarred.  PROCEDURE: Irrigation and excisional debridement of the right foot as well as wound VAC placement.   PERTINENT LABORATORIES AND EVALUATIONS: EKG on admission showed normal sinus rhythm. Blood cultures showed MRSA on May 29, 1 out of 2. Glucose 155, BUN 14, creatinine 0.83, sodium 139, potassium 4.4, chloride 104; CO2 was 28, calcium 9.6. LFTs showed alkaline phosphatase of 156, total protein 8.7. Albumin is 3. WBC 17.3, hemoglobin 8.8, platelet count 550. Chest x-ray showed no acute cardiopulmonary processes. Urinalysis was negative. Ultrasound of the lower extremities showed no lower extremity DVT. Foot x-ray on the right showed partial amputation of the first and the fifth metatarsal, changes of residual first metatarsal which could be postoperative or sequelae of osteomyelitis. Echocardiogram showed EF 55% to 60%, normal systolic function, no evidence of valvular involvement  noted. Repeat blood culture May 31 is negative. MRI of the right foot without contrast showed small fluid collection between the second and third metatarsals concerning for abscess, marrow edema within the distal second metatarsal shaft. Most recent creatinine is 1.50.   HOSPITAL COURSE: Please refer to H and P done by the admitting physician. The patient is an 81 year old white male with dementia, severe peripheral vascular disease, diabetes, who presented with fever. The patient was noted to have MRSA sepsis. He had a TEE, which was negative for any vegetation. He was started on vancomycin by ID. Repeat blood cultures have been negative. The source was felt to be his right foot where he had a wound. He underwent an MRI, on which there was some concern of abscess, but intraoperatively there was no abscess. He had debridement of that area by Dr. Wyn Quaker. He had a wound VAC placed. Since then, the patient has been on antibiotics and wound VAC. He has been afebrile. Dr. Leavy Cella recommended antibiotics for 2 weeks since his last blood culture remained negative, so his last antibiotic dose will be on 06/14. At this time, the patient is feeling stable and is stable for discharge to home with home health and wound VAC and IV home antibiotics.   DISCHARGE MEDICATIONS: Omeprazole 20 one tab p.o. b.i.d., Travatan Z 0.004% ophthalmic 1 drop each eye at bedtime, amlodipine/benazepril 5/20 daily, glimepiride 1 mg daily, multivitamin 1 tab p.o. daily, oxycodone 5 mg 1 to 2 tabs q.4 to 6 p.r.n. for pain, vitamin D3 with 1000 international units daily, aspirin 325 p.o. daily, gabapentin 300 one tab p.o. at bedtime, Tylenol  Extra-Strength 1000 mg q.6 p.r.n., vancomycin 750 IV q. 36 hours (stop date 06/14), vancomycin to be restarted on 06/12 due to vancomycin level being high.   DIET: Regular.   ACTIVITY: As tolerated as previously.   FOLLOWUP: The patient is to follow up with Dr. Leavy CellaBlocker 1 to 2 weeks. Follow up with Dr. Wyn Quakerew  next week for wound VAC and wound care. Follow up with primary MD in 1 to 2 weeks. Routine PICC care. Pharmacy dosing of antibiotics. The patient is to have a CBC and CMP in 4 days, results sent to Dr. Leavy CellaBlocker. Vanco trough also checked tomorrow, results to be sent to Dr. Leavy CellaBlocker. Continue wound VAC care.   TIME SPENT: 35 minutes.   ____________________________ Lacie ScottsShreyang H. Allena KatzPatel, MD shp:jm D: 08/11/2012 13:30:21 ET T: 08/11/2012 15:05:12 ET JOB#: [id number]  cc: Rayne Cowdrey H. Allena KatzPatel, MD, <Dictator> Charise CarwinSHREYANG H Vihaan Gloss MD ELECTRONICALLY SIGNED 08/15/2012 15:23

## 2014-06-24 NOTE — Consult Note (Signed)
Present Illness The patient is an 81 year old male with advanced dementia, history of CVA with left hemiparesis, bed bound status, blindness in the left eye, hypertension and diabetes mellitus who underwent right first, second and third toe amputations as well as fourth and fifth ray amputations by Dr. Lucky Cowboy for gangrenous changes. The patient was discharged home after the surgery.  The patient's wife noted him to have fever of 103. He was brought to the Emergency Department. Work-up in the Emergency Department with urinalysis, chest x-ray is unremarkable. The wound does not show any redness or drainage from the wound.  He has had multiple angiograms and debridement's on the right for attempted limb salvage these have been unsuccessful to this point. The patient has advanced dementia.  The patient received 1 dose of vancomycin and Zosyn. At baseline the patient has poor functional status, bedbound, minimally communicative. Dependent on all ADLs.   He is s/p left AKA Jan 17, 2012.  PAST MEDICAL HISTORY: 1.  CVA with left hemiparesis and left-sided contractors.  2.  Hypertension.  3.  Gastroesophageal reflux disease. 4.  Diabetes mellitus, on oral medication.  5.  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  6.  Severe peripheral vascular disease status post multiple angioplasties.  7.  Left AKA. 8.  History of multiple multidrug-resistant organism infections.  9.  Anemia of chronic disease as well as iron deficiency anemia with stool guaiac being positive. 10.  Advanced dementia.   PAST SURGICAL HISTORY: 1.  Left AKA. 2.  Cholecystectomy.  3.  Multiple angioplasties.  4.  Right transmetatarsal amputation.   Home Medications: Medication Instructions Status  omeprazole 20 mg oral delayed release capsule 1 cap(s) orally 2 times a day Active  Travatan Z 0.004% ophthalmic solution 1 drop(s) to each affected eye once a day (at bedtime)  right eye only Active  amlodipine-benazepril 5 mg-20 mg oral capsule 1  cap(s) orally once a day (in the morning) Active  glimepiride 1 mg oral tablet 1 tab(s) orally once a day (in the morning) Active  multivitamin 1 tab(s) orally once a day (in the morning) Active  oxyCODONE 5 mg oral tablet 1--2  tab(s) orally every 4 --6 hours as needed for pain Active  Vitamin D3 1000 intl units oral tablet 1 tab(s) orally once a day (in the morning) Active  Aspirin Enteric Coated 325 mg oral delayed release tablet 1 tab(s) orally once a day Active  gabapentin 300 mg/24 hours oral tablet, extended release 1 tab(s) orally once a day (at bedtime) Active  Tylenol Extra Strength 500 mg oral tablet 2 tab(s) orally every 6 hours, As Needed Active    No Known Allergies:   Case History:  Social History negative tobacco, negative ETOH, negative Illicit drugs   Review of Systems:  Medications/Allergies Reviewed Medications/Allergies reviewed   Physical Exam:  GEN well developed, ill appearing   HEENT dry oral mucosa, poor dentition   NECK supple  trachea midline   RESP normal resp effort  no use of accessory muscles   CARD regular rate  no JVD   ABD denies tenderness  soft   EXTR left AKA well healed. right leg with dressing , severe contracture noerythema no drainage.   SKIN positive ulcers, skin turgor poor   PSYCH poor insight, depressed   Nursing/Ancillary Notes: **Vital Signs.:   30-May-14 05:08  Temperature Temperature (F) 98.6  Celsius 37  Temperature Source AdultAxillary  Pulse Pulse 81  Respirations Respirations 18  Systolic BP Systolic BP  409  Diastolic BP (mmHg) Diastolic BP (mmHg) 67  Mean BP 89  Pulse Ox % Pulse Ox % 98  Pulse Ox Activity Level  At rest  Oxygen Delivery Room Air/ 21 %   Routine Chem:  30-May-14 05:06   Glucose, Serum  136  BUN 9  Creatinine (comp) 0.81  Sodium, Serum 136  Potassium, Serum 3.7  Chloride, Serum 104  CO2, Serum 26  Calcium (Total), Serum 8.8  Anion Gap  6  Osmolality (calc) 273  eGFR (African  American) >60  eGFR (Non-African American) >60 (eGFR values <17m/min/1.73 m2 may be an indication of chronic kidney disease (CKD). Calculated eGFR is useful in patients with stable renal function. The eGFR calculation will not be reliable in acutely ill patients when serum creatinine is changing rapidly. It is not useful in  patients on dialysis. The eGFR calculation may not be applicable to patients at the low and high extremes of body sizes, pregnant women, and vegetarians.)  Routine Hem:  30-May-14 05:06   WBC (CBC)  15.0  RBC (CBC)  3.17  Hemoglobin (CBC)  8.5  Hematocrit (CBC)  25.8  Platelet Count (CBC)  471  MCV 82  MCH 27.0  MCHC 33.1  RDW  14.9  Neutrophil % 78.4  Lymphocyte % 11.8  Monocyte % 8.5  Eosinophil % 0.8  Basophil % 0.5  Neutrophil #  11.7  Lymphocyte # 1.8  Monocyte #  1.3  Eosinophil # 0.1  Basophil # 0.1 (Result(s) reported on 31 Jul 2012 at 06:37AM.)   [place]    29-May-14 07:26, [place] Flow Doppler Lower Extrem Right (Leg)  [place] Flow Doppler Lower Extrem Right (Leg)   REASON FOR EXAM:    surgery yesterday with fever eval DVT  COMMENTS:       PROCEDURE: [place] - [place]OPPL[place] LOW EXTR RIGHT  - Jul 30 2012  7:26AM     RESULT: Doppler interrogation of the deep venous system of the right leg   is performed from the common femoral vein through the popliteal vein.   There is normal compressibility on grayscale images of the deep venous   system. Color Doppler and spectral Doppler appearance is normal. There is   no evidence of a popliteal fossa cyst. Prominent right groinlymph node   is present measuring 2.08 x 1.01 x 1.03 cm.    IMPRESSION:   1. No evidence of right lower extremity deep vein thrombosis.  Dictation Site: 2        Verified By: GSundra Aland M.D., MD    Impression 1.  Sepsis.  The patient has fever of more than 102, WBC count more 17,000. Given the results of the initial work up I am suspicious that the right foot is the  source of his infection.  Optimal therepy would be a right AKA.  Wife has not accepted this to this point.  We will continue discussion with her. 2.  Diabetes mellitus. Continue with his home medications and keep the patient on sliding scale insulin.  3.  Hypertension. Continue with home medications.   4.  Advanced dementia. Discussed with the patient???s wife regarding the code status.  The patient???s wife wanted him to be DNR/DNI. Would also benefit from having further discussions in regards to palliative care considering the patient's poor functional status and multiple medical problems.  5.  Anemia. Dropped from 10.6 to 10.8, most likely from post-surgery. Continue the iron.   Plan Consult 4 (if not  under global)   Electronic Signatures: Hortencia Pilar (MD)  (Signed 31-May-14 14:33)  Authored: General Aspect/Present Illness, Home Medications, Allergies, History and Physical Exam, Vital Signs, Labs, Radiology, Impression/Plan   Last Updated: 31-May-14 14:33 by Hortencia Pilar (MD)

## 2014-06-24 NOTE — Op Note (Signed)
PATIENT NAME:  Melvin Obrien, Melvin Obrien MR#:  [id number] DATE OF BIRTH:  [birth year]  DATE OF PROCEDURE:  06/25/2012  PREOPERATIVE DIAGNOSES: 1.  Gangrene, right 2nd toe.  2.  Status post right great toe amputation.  3.  Peripheral vascular disease.  4.  Status post left above-knee amputation.  5.  Stroke.   POSTOPERATIVE DIAGNOSES: 1.  Gangrene, right 2nd toe.  2.  Status post right great toe amputation.  3.  Peripheral vascular disease.  4.  Status post left above-knee amputation.  5.  Stroke.  PROCEDURES: Right 2nd ray amputation.   SURGEON: Annice NeedyJason S. Deadrian Toya, M.D.   ANESTHESIA: MAC.   ESTIMATED BLOOD LOSS: Minimal.   INDICATION FOR PROCEDURE: This is an 81 year old African American male with known multiple medical issues and peripheral vascular disease. She is status post right great toe amputation, which has actually healed. He has gangrenous changes and exposed bone on the right 2nd toe, and a toe amputation is planned today.   DESCRIPTION OF PROCEDURE: The patient is brought to the operative suite, and after an adequate level of intravenous sedation obtained, the right lower extremity was sterilely prepped and draped, and a sterile surgical field was created. A curvilinear incision was created around the base of the right 2nd toe in what appeared to be healthy, viable skin. I then used a scalpel and then electrocautery to dissect down to the bone, and the digit was removed in its entirety. The metatarsal head was taken with bone cutters, and the bone edges were smoothed with the rongeur device. The wound was then irrigated and closed with three figure-of-eight 2-0 Vicryl sutures. The skin was coapted with 5 mattress nylon sutures. A sterile dressing was placed. The patient tolerated the procedure well and was taken to the recovery room in stable condition.   ____________________________ Annice NeedyJason S. Cortina Vultaggio, MD jsd:lg D: 06/25/2012 14:43:58 ET T: 06/25/2012 14:49:48 ET JOB#: [id number]  cc: Annice NeedyJason S.  Annaleese Guier, MD, <Dictator> Annice NeedyJASON S Chari Parmenter MD ELECTRONICALLY SIGNED 06/29/2012 9:29

## 2014-06-24 NOTE — Op Note (Signed)
PATIENT NAME:  Melvin Obrien, Melvin Obrien MR#:  [id number] DATE OF BIRTH:  06/10/1924  DATE OF PROCEDURE:  08/06/2012  PREOPERATIVE DIAGNOSES: 1.  Right foot infection.  2.  Status post transmetatarsal amputation with serial toe amputations for gangrene of the toes.  3.  Peripheral vascular disease, status post intervention.   POSTOPERATIVE DIAGNOSES:  1.  Right foot infection.  2.  Status post transmetatarsal amputation with serial toe amputations for gangrene of the toes.  3.  Peripheral vascular disease, status post intervention.   PROCEDURE:  1.  Irrigation and excisional debridement of right foot over about 30 sq cm.  2.  VAC dressing placement.   SURGEON: Annice NeedyJason S Dew, MD   ANESTHESIA: MAC.   ESTIMATED BLOOD LOSS: Minimal.   INDICATION FOR PROCEDURE: An 81 year old African American male with multiple toe amputations and debridements of his feet.  He presents with sepsis and was felt to have a right foot abscess on an MRI. He is brought in for debridement of his right foot wound.  The risks and benefits were discussed.   DESCRIPTION OF PROCEDURE: The patient was  brought to the operative suite, and after an adequate level of intravenous sedation was obtained, the right foot was sterilely prepped and draped and a sterile surgical field was created. The majority of his previous incision line was reopened. There was some fibrinous and nonviable tissue in the skin, soft tissue and muscle that was debrided sharply with scissors.  The total area of debridement was about 30 sq cm. There was really not a pocket of pus or purulence.  I probed between metatarsals, I probed into the mid foot and did not really find any undrained purulent material. The wound was then irrigated. A VAC dressing was cut to fit the wound, and a good occlusive seal was obtained.   The patient was then awakened from anesthesia and taken to the recovery room  in stable condition    ____________________________ Annice NeedyJason S. Dew,  MD jsd:cb D: 08/06/2012 17:11:23 ET T: 08/06/2012 21:41:08 ET JOB#: [id number]  cc: Annice NeedyJason S. Dew, MD, <Dictator> Annice NeedyJASON S DEW MD ELECTRONICALLY SIGNED 08/12/2012 10:59

## 2014-06-24 NOTE — H&P (Signed)
PATIENT NAME:  Melvin Obrien, Melvin Obrien MR#:  [id number] DATE OF BIRTH:  [birth year]  DATE OF ADMISSION:  07/30/2012  PRIMARY CARE PHYSICIAN:  Florentina JennyHenry Tripp, MD  PRIMARY VASCULAR SURGEON:  Festus BarrenJason Dew, MD  REFERRING PHYSICIAN: Lucrezia EuropeAllison Webster, MD  CHIEF COMPLAINT: Fever.   HISTORY OF PRESENT ILLNESS: [name] is an 81 year old African American male with advanced dementia, history of CVA with left hemiparesis, bed bound status, blindness in the left eye, hypertension and diabetes mellitus who underwent right first, second and third toe amputations as well as fourth and fifth ray amputations by Dr. Wyn Quakerew for gangrenous changes. The patient was discharged home after the surgery.  The patient's wife noted him to have fever of 103. Considering this EMS was called and was brought to the Emergency Department. Work-up in the Emergency Department with urinalysis, chest x-ray is unremarkable. The wound does not show any redness or drainage from the wound. The patient has advanced dementia. Cannot obtain any history from the patient. The history is mainly obtained from the patient's wife who is at bedside. Per Emergency Department physician, discussed with Dr. Wyn Quakerew who recommended the patient to be admitted to the medicine service. The patient received 1 dose of vancomycin and Zosyn. At baseline the patient has poor functional status, bedbound, minimally communicative. Dependent on all ADLs.   PAST MEDICAL HISTORY: 1.  CVA with left hemiparesis and left-sided contractors.  2.  Hypertension.  3.  Gastroesophageal reflux disease. 4.  Diabetes mellitus, on oral medication.  5.  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  6.  Severe peripheral vascular disease status post multiple angioplasties.  7.  Left AKA. 8.  History of multiple multidrug-resistant organism infections.  9.  Anemia of chronic disease as well as iron deficiency anemia with stool guaiac being positive. 10.  Advanced dementia.   PAST SURGICAL HISTORY: 1.  Left  AKA. 2.  Cholecystectomy.  3.  Multiple angioplasties.  4.  Right transmetatarsal amputation.  ALLERGIES: No known drug allergies.   HOME MEDICATIONS: 1.  Vitamin D3 one tablet once a day.  2.  Tylenol Extra Strength 2 tablets every 6 hours. 3.  Travatan eyedrops.  4.  Oxycodone 5 mg 1 to 2 tablets every 4 to 6 hours as needed.  5.  Omeprazole 20 mg 2 times a day.  6.  Multivitamin 1 tablet once a day.  7.  Glimepiride 1 mg 1 tablet orally once a day.  8.  Gabapentin 300 mg at bedtime.  9.  Aspirin 81 mg daily.  10.  Amlodipine and benazepril 1 capsule once a day.   SOCIAL HISTORY: Quit smoking 2 to 3 years back. No history of alcohol and drug use. Lives with his wife who is medical power of attorney.  The patient's wife stated that the patient is DNR/DNI.   FAMILY HISTORY:  Could not be obtained from the patient as the patient is somnolent and has dementia.  REVIEW OF SYSTEMS: Could not be obtained from the patient secondary to dementia as well as being somnolent.   PHYSICAL EXAMINATION: GENERAL: This is a cachectic-looking male lying down in the bed, not in distress, chronically ill looking. VITAL SIGNS: Temperature 99.7, pulse 89, blood pressure 159/73, respiratory rate 20, oxygen saturations 95% on room air.  HEENT: Head normocephalic, atraumatic. There is no scleral icterus. Conjunctivae normal. Pupils equal and react to light.  Bitemporal wasting. Could not examine the extraocular movements. Mucous membranes: Mild dryness.  NECK: Supple. No lymphadenopathy. No JVD. No carotid  bruit. No thyromegaly.  CHEST: Has no focal tenderness.  LUNGS: Bilaterally clear to auscultation.  HEART: S1 and S2 regular. No murmurs are heard.  ABDOMEN: Bowel sounds present. Soft, nontender, nondistended.  EXTREMITIES: Left AKA.  Right transmetatarsal in dressing.  Has postsurgical wound.  Does not show any increased redness and no drainage from the wound. Could not examine the range of motion.   SKIN: No rash or lesions.  LYMPH:  No inguinal or axillary lymphadenopathy.  NEUROLOGIC:  The patient is quite somnolent, not oriented to place, person and time. No apparent cranial nerve abnormalities. Could not examine the motor or sensory. No neck rigidity.   LABORATORY DATA: UA negative for nitrites and leukocyte esterase. CMP is completely within normal limits. CBC: WBC of 17.3, hemoglobin 8.8 and platelet count of 550.   ASSESSMENT AND PLAN: Mr. Newsome is an 81 year old male who is brought to the Emergency Department with fever.  1.  Sepsis.  The patient has fever of more than 102, WBC count more 17,000. No obvious source of infection at this time is identified. Urinalysis and chest x-ray are negative. Postop day 1.  Unlikely the source of the infection. However, blood cultures have been obtained. The patient received 1 dose of vancomycin and Zosyn. The patient has history of multiple methicillin-resistant Staphylococcus aureus infections. Follow up with blood cultures. If the patient continues to be afebrile and the cultures come back to be negative, can de-escalate the antibiotics.  2.  Diabetes mellitus. Continue with his home medications and keep the patient on sliding scale insulin.  3.  Hypertension. Continue with home medications.   4.  Advanced dementia. Discussed with the patient's wife regarding the code status.  The patient's wife wanted him to be DNR/DNI. Would also benefit from having further discussions in regards to palliative care considering the patient's poor functional status and multiple medical problems.  5.  Anemia. Dropped from 10.6 to 10.8, most likely from post-surgery. Continue the iron.  6.  Keep the patient on deep vein thrombosis prophylaxis with SCDs.   TIME SPENT: 50 minutes.  ____________________________ Susa Griffins, MD pv:sb D: 07/30/2012 08:50:09 ET T: 07/30/2012 09:17:56 ET JOB#: [id number]  cc: Susa Griffins, MD, <Dictator> Susa Griffins  MD ELECTRONICALLY SIGNED 07/31/2012 7:34

## 2014-06-24 NOTE — Op Note (Signed)
PATIENT NAME:  Melvin Obrien, Melvin Obrien MR#:  [id number] DATE OF BIRTH:  1924-09-09  DATE OF PROCEDURE:  07/23/2012  PREOPERATIVE DIAGNOSES:  1.  Peripheral arterial disease with gangrene, right foot.  2.  Status post left above-knee amputation.  3.  Hypertension.  4.  Stroke.   POSTOPERATIVE DIAGNOSES: 1.  Peripheral arterial disease with gangrene, right foot.  2.  Status post left above-knee amputation.  3.  Hypertension.  4.  Stroke.   PROCEDURES: 1.  Ultrasound guidance for vascular access to the left femoral artery.  2.  Catheter placement in the 2 right posterior tibial arteries from a left femoral approach.  3.  Aortogram, selective right lower extremity angiogram.  4.  Crosser atherectomy to right posterior tibial artery.  5.  Percutaneous transluminal angioplasty of proximal right posterior tibial artery with 2.5 mm diameter angioplasty balloon.  6.  StarClose closure device to the left femoral artery.   SURGEON:  Annice Needy, MD  ANESTHESIA:  Local with moderate conscious sedation.   ESTIMATED BLOOD LOSS:  20 to 25 mL.   INDICATION FOR PROCEDURE:  This is an 81 year old African American male with gangrenous toes on the right foot. He has had previous revascularizations. He is brought in for angiography today to see if any further revascularization is possible to improve his wound healing. He has already had 3 toes removed from right foot. His fourth toe is gangrenous. His fifth toe is questionable. He is very debilitated with multiple medical issues.   DESCRIPTION OF PROCEDURE:  The patient is brought to the Vascular and Interventional Radiology Suite. The groins were shaved, prepped and a sterile surgical field was created. The left femoral artery was visualized with ultrasound and found to be patent. It was then accessed under direct ultrasound guidance without difficulty with Seldinger needle. A J-wire and 5-French sheath were placed. A pigtail catheter in the aorta at the L1-L2  level, and an AP aortagram was performed. This showed patent renal arteries bilaterally without significant stenosis. The aortic and iliac segments were patent without significant stenosis. I then hooked the aortic bifurcation with the catheter, advanced to the right femoral head. Selective right lower extremity angiogram was then performed. The catheter had to be advanced to the superficial femoral artery to see distally due to a slow perfusion time. This demonstrated a patent common femoral artery, profunda femoris artery, the superficial femoral artery was heavily calcified but not stenotic. The popliteal artery was patent. There was then an occluded anterior tibial artery proximally, which did not reconstitute distally. The posterior tibial artery was occluded flush, but did have a distal reconstitution. The peroneal artery was actually patent to its normal termination at the ankle and then collateralized mostly in the posterior tibial arteries to fill the foot. Then treated the posterior tibial artery to try to improve his tibial perfusion as much as possible. The patient was heparinized. A 6-French Ansell sheath was placed over the Terumo Advantage wire with a Kumpe catheter and the  Advantage wire was able to gain access to what appeared to be a short stump of the posterior tibial artery. The F6 Crosser atherectomy device was then placed after the Usher catheter was parked in the stump. This was able to advance into the distal posterior tibial artery but the Usher catheter would not advance beyond about the proximal 10 cm of the posterior tibial artery. Without support we have doubt; imaging through the Usher catheter showed we were in the posterior tibial artery but  it was heavily calcified small vessel and we were unable to track beyond this. I was then able to get a wire down into the posterior tibial artery and a 2.5 mm diameter angioplasty balloon was used to inflate the proximal one-third of the  posterior tibial artery. This was beyond the area the Usher catheter had hung up. At this point there was some improved flow in the posterior tibial although there was still a distal occlusion. The collaterals were better and further attempts at crossing this lesion were unsuccessful so I elected to terminate the procedure. The oblique arteriogram was performed through the left femoral sheath, and a StarClose closure device was deployed in the usual fashion with excellent hemostatic result. The patient tolerated the procedure well and was taken to the Recovery Room in stable condition.    ____________________________ Annice NeedyJason S. Javier Mamone, MD jsd:jm D: 07/23/2012 13:51:00 ET T: 07/23/2012 14:51:52 ET JOB#: [id number]  cc: Annice NeedyJason S. Jaxton Casale, MD, <Dictator> Annice NeedyJASON S Shaguana Love MD ELECTRONICALLY SIGNED 07/29/2012 13:57

## 2014-06-25 NOTE — Discharge Summary (Signed)
PATIENT NAME:  Melvin Obrien, Melvin Obrien MR#:  [id number] DATE OF BIRTH:  1924/07/09  DATE OF ADMISSION:  11/07/2013 DATE OF DISCHARGE:  11/13/2013  PRIMARY CARE PHYSICIAN:  Drue DunHenry F. Tripp, M.D.  DISCHARGE DIAGNOSES:  1.  Clostridium difficile colitis. 2.  Acute renal failure due to acute tubular necrosis from hypoperfusion from his ileus. 3.  Hypertension.  4.  Diabetes.  5.  Coronary artery disease.   CONDITION: Poor prognosis.   CODE STATUS:  DO NOT RESUSCITATE.   HOME MEDICATIONS: Please refer to the medication reconciliation list.    The patient will be discharged to home with hospice care.  Needs home oxygen 2 liters by nasal cannula continuously.   DIET: Regular pureed diet.   ACTIVITY: As tolerated.   FOLLOWUP CARE:  Follow up with PCP p.r.n.   CONSULTATIONS:  1.  Surgery, Dr. Tiney Rougealph Ely. 2.  Nephrology, Dr. Lamont DowdySarath Kolluru.   3.  Gastrointestinal, Dr. Dow AdolphMatthew Rein. 4. Palliative care, Mr. Pamala HurryBorder.  REASON FOR ADMISSION: Abdominal pain, diarrhea, and cough.   HOSPITAL COURSE: The patient is an 81 year old African-American male with a history of a CVA, dementia, PVD, hypertension, who was sent to the Emergency Department due to abdominal pain, diarrhea, and cough. The patient was noticed to have an elevated WBC at 41.7 in the Emergency Department.  For detailed history and physical examination, please refer to the admission note dictated by Dr. Auburn BilberryShreyang Patel.  Laboratory data on admission date showed a glucose of 140, BUN 50, creatinine 2.93, sodium 131, potassium 4.0, chloride 100.  WBCs 41.7, hemoglobin 13.2.    KUB showed a nonspecific bowel gas pattern with mild gaseous prominence of several loops of bowel; no overt obstruction.  Chest x-ray showed intracardiac opacity.    For abdominal pain, diarrhea, and abdominal distention, it is likely due to Clostridium difficile colitis.  The patient's stool test showed Clostridium difficile.  The patient has been treated with  vancomycin p.o.  However, the patient still had abdominal distention with highly elevated WBCs.  The patient had Clostridium difficile colitis without evidence of megacolon but with a functional ileus from colon dysmotility.  According to surgical consult, no surgical issue at this time.  It was suggested to put in an NG tube; however, family members decided on comfort care.  They did not want a NGT.  Leukocytosis.  The patient still has a leukocytosis increased to 50.4.    Acute renal failure on chronic kidney disease. The patient's creatinine increased to 3.11.  BUN increased to 77.  The patient was treated with IV fluid support.  Renal ultrasound shows medical renal diagnosis.   Overall, the patient has a very poor prognosis.  The patient's family members decided on comfort care for the patient.  The patient will be discharged to home with hospice care today.  Discussed the patient's discharge plan with nurse and case manager.   TIME SPENT: About 38 minutes.    ____________________________ Shaune PollackQing Jasmynn Pfalzgraf, MD qc:nr D: 11/13/2013 12:15:15 ET T: 11/13/2013 13:08:07 ET JOB#: [id number]  cc: Shaune PollackQing Leeba Barbe, MD, <Dictator> Drue DunHenry F. Redmond Schoolripp, MD Laverda SorensonSarath C. Kolluru, MD Carmie Endalph Obrien. Ely III, MD Dow AdolphMatthew Rein, MD   Shaune PollackQING Damany Eastman MD ELECTRONICALLY SIGNED 11/13/2013 14:43

## 2014-06-25 NOTE — Consult Note (Signed)
   Comments   Spoke with pt's wife, Gerome Apleyattie, she has come to the decision to take pt home and make pt comfort care until transport can be arranged for pt to return home.  Wife states she would not like pt to have NG or agressive IV fluids and is aware receiving this could help him.  Wife states that she thinks pt would "continue to decline no matter what we try".  This is the reason wife states that she just wants pt to be comfortable. She would like Hopsice involved at home and realizes pt may be nearing end of life.   CM, SW and hospitalist.  Electronic Signatures: Reather LaurenceMantzouris, Jacqulynn Shappell J (NP)  (Signed 11-Sep-15 14:36)  Authored: Palliative Care   Last Updated: 11-Sep-15 14:36 by Reather LaurenceMantzouris, Yer Castello J (NP)

## 2014-06-25 NOTE — Consult Note (Signed)
PATIENT NAME:  Melvin Obrien, Melvin Obrien MR#:  [id number] DATE OF BIRTH:  Apr 24, 1924  DATE OF CONSULTATION:  11/10/2013  REFERRING PHYSICIAN:  Sital P. Juliene Pina, MD CONSULTING PHYSICIAN:  Hardie Shackleton. Colin Benton, PA-C  ATTENDING GASTROENTEROLOGIST: Dow Adolph, MD   REASON FOR CONSULTATION: Clostridium difficile colitis with a small bowel obstruction, not currently a surgical candidate.   HISTORY OF PRESENT ILLNESS: This is a pleasant 81 year old gentleman with a past medical history significant for dementia, who is a poor historian and is currently alone in his bedroom, therefore, most of his history is obtained from his nurse. He was admitted with Clostridium difficile  colitis that was confirmed positive in his stool sample. He also had marked pancolitis on his CT scan of the abdomen and pelvis. He was started on p.o. vancomycin 250 mg every 6 hours as well as IV Flagyl 500 mg every 8 hours. He is currently on a clear liquid diet. He was admitted with severe leukocytosis with white blood cells 41.7 at admission, and this has gradually trended up to 52.1 today. He has been tolerating a clear liquid diet without difficulty and is not having any nausea or vomiting. Per the nurse, his diarrhea has significantly improved and he has actually only had 1 well-formed bowel movement this morning. There were concerns because he developed severe amounts of abdominal distention and as a result he underwent an abdominal x-ray yesterday, 11/09/2013. This showed dilation of the small bowel loops that was increasing compared to a prior KUB suggesting a functional small bowel obstruction. Surgery was consulted and Dr. Michela Pitcher came to evaluate the patient. It was determined that he was not a surgical candidate due to multiple comorbidities and the absence of any perforation or toxic megacolon. We are consulted for GI opinion. Blood cultures are pending. Liver enzymes are unremarkable. He is tolerating a clear liquid diet without any nausea or  vomiting.   PAST MEDICAL HISTORY:  1.  History of a CVA with left-sided weakness and also a significant amount of dementia.  2.  History of MRSA.  3.  Peripheral vascular disease.  4.  GERD.  5.  Diabetes mellitus.  6.  Hypertension. 7.  Left eye blindness.   PAST SURGICAL HISTORY: Left above-the-knee amputation, cholecystectomy, multiple angioplasties, right metatarsal amputation.   FAMILY HISTORY: There is no known family history of a GI malignancy, colon polyps or IBD per medical records, but again the patient is a poor historian and I have not been able to chat with any of his family members at this point.   SOCIAL HISTORY: The patient denies any alcohol, tobacco or illicit drug use. He has a home health nurse and he does live with his wife.   ALLERGIES: No known drug allergies.   HOME MEDICATIONS: Amlodipine, benazepril, vitamin D, senna, Robitussin, amoxicillin, Lyrica, nystatin, aspirin, omeprazole, glimepiride, Colace, triamcinolone.   REVIEW OF SYSTEMS: Unobtainable due to the patient's poor mentation and dementia.   PHYSICAL EXAMINATION:  VITAL SIGNS: Blood pressure 132/65, heart rate 91, respirations 18, temperature 98.3, bedside pulse oximetry is 100%.  GENERAL: This is a pleasant 81 year old gentleman resting quietly and comfortably in bed in no acute distress. He is alert but unable to tell if he is oriented x 3 due to his severe dementia.  HEAD: Atraumatic, normocephalic.  NECK: Supple. No lymphadenopathy noted.  HEENT: Sclerae anicteric. mucus menbranes moist.  LUNGS: Respirations are even and unlabored. Clear to auscultation bilateral anterior lung fields.  HEART: Regular rate and rhythm. S1, S2  noted.  ABDOMEN: Firm and markedly distended, very tense skin, extremely quiet bowel sounds, very difficult to auscultate. He is nontender on exam. No signs or guarding or rebound. No signs of an acute abdomen.  RECTAL: Deferred.  EXTREMITIES: Negative for lower extremity  edema, 2+ pulses noted in bilateral upper extremities.  NEUROLOGIC: As above, he does have dementia with some neurological deficits secondary to history of stroke with some current dementia playing a role.   LABORATORY DATA: White blood cells 52.1, hemoglobin 11.2, hematocrit 36.9, platelets 593,000, MCV 88. Sodium 139, potassium 3.7, BUN 49, creatinine 2.85, glucose 116. PT 15.1, INR 1.2, bilirubin 0.4, alkaline phosphatase 85, ALT 11, AST 23, albumin 1.9. Clostridium difficile is positive. Blood cultures are currently pending.   IMAGING: A CT scan of the abdomen and pelvis without contrast was obtained on the patient showing marked colitis/pancolitis suggestive of Clostridium difficile. There is also small volume abdominal pelvic ascites.   A KUB was obtained on the patient showing nonspecific bowel gas pattern with no overt obstruction. Following this, he underwent an abdominal x-ray on 11/09/2013 showing increased dilation of the small bowel loops favoring a functional small bowel obstruction.   ASSESSMENT:  1.  Clostridium difficile pancolitis.  2.  Small bowel obstruction confirmed on abdominal x-ray.  3.  Marked abdominal distention.  4.  Marked leukocytosis.  5.  Diarrhea, improved. The patient has only had 1 well-formed bowel movement so far today  PLAN: I have discussed this patient's case in detail with Dr. Dow AdolphMatthew Rein who is involved in the development of the patient's plan of care. Because he was confirmed Clostridium difficile positive with marked pancolitis, we do agree with the patient being on p.o. vancomycin 250 mg every 6 hours as well as IV Flagyl 500 mg q. 8 hours. He is still passing stool and is not currently having any nausea or vomiting, but we do agree with the consideration of an NG tube for some decompression of his tense abdomen. We will defer this decision to the surgical team, who has been following the patient closely. In regard to his leukocytosis, we are awaiting  the results of a  blood culture, though Clostridium difficile can often present with this degree of leukocytosis. Surgery has seen and evaluated this patient and he was not deemed a surgical candidate. Because his diarrhea has improved as well, we do recommend continuing antibiotics as directed and continuing symptomatic management. Of note, he is DNR status. We will continue to monitor this patient very closely throughout hospitalization and make further recommendations per clinical course.   Thank you so much for this consultation and for allowing us to participate in the patient's plan of care.    ____________________________ Hardie ShackletonKaryn M. Ramiro Pangilinan, PA-C kme:TT D: 11/10/2013 16:01:31 ET T: 11/10/2013 16:19:31 ET JOB#: [id number]  cc: Hardie ShackletonKaryn M. Camree Wigington, PA-C, <Dictator> Hardie ShackletonKARYN M Tambra Muller PA ELECTRONICALLY SIGNED 11/11/2013 9:36

## 2014-06-25 NOTE — Consult Note (Signed)
Details:   - GI Note:  Little change this am.  Cannot obtain ROS  Exam: -altered, no distress -course breath sounds bilat - abd very distended, poor bowel sounds.  Recs:  - cont PO vanc - comfort care vs NG decompression  - no addl recs at this time   Electronic Signatures: Dow Adolphein, Itzabella Sorrels (MD)  (Signed 11-Sep-15 12:41)  Authored: Details   Last Updated: 11-Sep-15 12:41 by Dow Adolphein, Rorik Vespa (MD)

## 2014-06-25 NOTE — Consult Note (Signed)
PATIENT NAME:  Melvin Obrien, Melvin Obrien MR#:  [id number] DATE OF BIRTH:  [birth year]  DATE OF CONSULTATION:  11/09/2013  REFERRING PHYSICIAN:   CONSULTING PHYSICIAN:  Melvin Endalph Obrien. Ely III, Obrien  PRIMARY CARE PHYSICIAN: Melvin DunHenry F. Tripp, Obrien  ADMITTING PHYSICIAN: [facility]   CHIEF COMPLAINT: Abdominal pain and distention in a patient with Clostridium difficile colitis.   BRIEF HISTORY: Melvin Obrien is an 81 year old gentleman with multiple medical problems. He is primary vasculopath with previous left lower extremity above-knee amputation for distal infection, recently treated with a multi-course of antibiotics for persistent infection in his right foot. He has been admitted on several occasions. He has severe hypertension, diabetes, dementia. He was noted to have abdominal distention and significant diarrhea, which prompted evaluation in the Emergency Room. He was admitted, diagnosed with Clostridium difficile colitis, started appropriate antibiotic therapy. He has really not improved with increasing distention. CT scan demonstrates thickening from the sigmoid through the left colon primarily, but some edema in the wall thickness in the cecum in addition. Plain films today revealed stacked small bowel loops suggestive of a small bowel obstruction. The patient's only previous abdominal surgery was a cholecystectomy. He has had multiple vascular procedures.   MEDICATIONS: Outlined in his admission note.  ALLERGIES: Outlined in his admission note.  SOCIAL HISTORY: Outlined in his admission note.   It is very difficult to obtain history as the patient is primarily demented and does not really answer questions appropriately.  PHYSICAL EXAMINATION:  GENERAL: He is lying in bed at rest asking for discharge.  VITAL SIGNS: Blood pressure is 112/66. He is afebrile, 97.4, he has a heart rate of 90 and regular.  HEENT: Reveals nonreactive left eye. Otherwise, he has no significant abnormalities. No facial  deformities.  NECK: Supple, nontender with midline trachea.  LUNGS: Reveals normal pulmonary excursion. No adventitious sounds.  CARDIAC: No murmurs or gallops to my ear. He seems to be in normal sinus rhythm.  ABDOMEN: Markedly distended with tense, shiny skin, tympany, generalized abdominal tenderness. No rebound, no guarding.  EXTREMITIES: Reveals a surgically absent left lower extremity and some mild edema in the stump. Right side reveals no edema and decreased range of motion.  PSYCHIATRIC: Reveals marked dementia.   ASSESSMENT AND PLAN: I independently reviewed the CT scan. This gentleman does appear to have Clostridium difficile colitis with a pancolitis picture. Despite the fact that he does not have significant changes throughout the colon, on equal magnitude there is edema, wall swelling in the ascending colon and cecum. His functional obstruction is likely related to colonic dysmotility from his infection. He is on appropriate antibiotic therapy. He would be a candidate for a total colectomy if surgery were indicated. Normal surgical indications for Clostridium difficile include megacolon or perforation. I would suggest considering a stool transplant in this patient since he has such a profound colitis. He would be a very poor surgical candidate for a total colectomy and ileostomy.   We will be happy to follow him while he is hospitalized.    ____________________________ Melvin Obrien. Ely III, Obrien rle:TT D: 11/09/2013 20:44:48 ET T: 11/09/2013 21:13:10 ET JOB#: [id number]  cc: Melvin Endalph Obrien. Ely III, Obrien, <Dictator> Melvin DunHenry F. Redmond Schoolripp, Obrien Melvin Obrien ELECTRONICALLY SIGNED 11/10/2013 6:42

## 2014-06-25 NOTE — Consult Note (Signed)
Details:   - GI Note:  S:  seems to be worsening. Abd pain, altered.    O: vss abd: very distended, poor bs, nt chest: + accesory muscle use, + crackle CV: reg, no mr//g  A/P: 1.) C.diff with severe ileus.   Becoming tachypneic, bicarb dropping.  Likely becoming acidotic and may decompensate.   Would recommend mvoing toward comfort measures.  Cont PO vanc for now.   Electronic Signatures: Dow Adolphein, Marta Bouie (MD)  (Signed 10-Sep-15 18:31)  Authored: Details   Last Updated: 10-Sep-15 18:31 by Dow Adolphein, Moataz Tavis (MD)

## 2014-06-25 NOTE — Consult Note (Signed)
Details:   - GI Note:  I agree with Stark KleinKaryn Earle's a/p.  Agree with PO vancomycin.  Stool transplant in this setting would be considered highly experimental and would be difficult to deliver given the likely ileus.  Would cont PO vancomycin, can increase to 500 q8 if little improvement on 125 mg.   Will follow.   Electronic Signatures: Dow Adolphein, Parris Signer (MD)  (Signed 10-Sep-15 12:10)  Authored: Details   Last Updated: 10-Sep-15 12:10 by Dow Adolphein, Navarre Diana (MD)

## 2014-06-25 NOTE — H&P (Signed)
PATIENT NAME:  Melvin Obrien, Melvin Obrien MR#:  [id number] DATE OF BIRTH:  07-04-1924  DATE OF ADMISSION:  11/07/2013  PRIMARY CARE PROVIDER: Florentina JennyHenry Tripp, MD.   EMERGENCY DEPARTMENT REFERRING PHYSICIAN: Sheryl Obrien. Mindi JunkerGottlieb, MD.   CHIEF COMPLAINT: Abdominal pain, diarrhea, cough.   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 Redmond Schoolripp. The patient started having complained of cough about 2 weeks ago and he was started on a course of antibiotics. His symptoms persisted, therefore, he received a second course of antibiotics, which was started 5 days ago. The patient subsequently started having severe diarrhea, according to his wife, 4 days ago with a foul order to it. He was having multiple bowel movements. Then, over the past few days, he also has had distention of his abdomen and has been complaining of abdominal pain. Therefore, he was brought to the emergency room.   The patient is a very poor historian. Although he is awake, he is on unable to give me any history. His wife provided most of the history. He has not had any fevers, according to her knowledge. The patient in the ED was noted to have a significantly elevated WBC count of 41.7. Therefore, we were asked to admit the patient.   PAST MEDICAL HISTORY: 1. History of sepsis from methicillin-resistant Staphylococcus aureus bacteremia due to foot infection.  2. Peripheral vascular disease.  3. Dementia.  4. Chronic anemia.  5. History of CVA with left-sided weakness.  6. Hypertension.  7. GERD.  8. Diabetes.  9. 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  10. Left above-knee amputation.  11. 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  12. Anemia of chronic  disease, as well as iron deficiency anemia.  13. Status post cholecystectomy.  14. Status post multiple angioplasties.  15. Status post right metatarsal amputation.   ALLERGIES: None.   MEDICATIONS: From the bottles that the wife has brought in, he is on: Amlodipine, benazepril 5/20 mg 1 tab p.o. daily, vitamin D 1000 international units daily, Senna 1 tablet p.o. b.i.d., Robitussin-DM p.r.n., aspirin 325 p.o. daily, omeprazole 20 daily, Lyrica 50 daily, amoxicillin/clavulanic acid 1 tab p.o. b.i.d., glimepiride 1 mg daily, nystatin cream and triamcinolone cream p.r.n., Colace 100 p.o. b.i.d. He is on probiotics as well as an antidiarrheal but she is not sure what antidiarrheal he is on.   SOCIAL HISTORY: Does not smoke. Does not drink. He lives with his wife at home. He does have a home health nurse coming to visit.   FAMILY HISTORY: Positive for hypertension.   REVIEW OF SYSTEMS: Unable to obtain due to patient's dementia and unable to give me any history.   PHYSICAL EXAMINATION: VITAL SIGNS: Temperature 97.4, pulse 90, respirations 22, blood pressure 85/54, O2 of 91%.  GENERAL: The patient is a frail-looking, elderly male.  HEENT: Head atraumatic normocephalic. Pupil in the left eye nonreactive due to his blindness. Right eye pupil equally round, reactive to light and accommodation. Extraocular movement intact. Nasal exam shows no drainage or ulceration. External ear exam shows no erythema. Oropharynx is clear without any exudate.  NECK: Supple without any JVD.  CARDIOVASCULAR: Regular rate and rhythm. No murmurs, rubs, clicks or gallops.  LUNGS: Clear to auscultation bilaterally without any rales, rhonchi, wheezing.  ABDOMEN: Abdominal distention. Patient uncomfortable  when his abdomen is pressed.  Soft. Positive bowel sounds x 4. No hepatosplenomegaly.  GENITOURINARY: The patient has scrotal edema noted.  EXTREMITIES: He has left AKA right lower extremity. There is no swelling or edema.   SKIN: No rash.  LYMPH NODES: Nonpalpable.  VASCULAR: Good DP, PT pulses.   PSYCHIATRIC: Not anxious. Not oriented to place, person or time.  NEUROLOGIC: He has left-sided hemiparesis. Able to move right upper extremity. Cranial nerves 2 through 12 appear intact.    LABORATORY DATA: Glucose 140, BUN 50, creatinine 2.93, sodium 131, potassium 4.0, chloride 100, CO2 is 18. Lipase 57. LFTs: Total protein 6.8, albumin 2.3, bilirubin total 0.7, alkaline phosphatase 121, AST 20, ALT is 16. Troponin less than 0.02. WBC 41.7, platelet count 463,000, hemoglobin is 13.2.   DIAGNOSTIC DATA: Chest x-ray shows low lung volumes with intracardiac opacity, which could indicate atelectasis but pneumonia could be similar in appearance. KUB shows nonspecific bowel gas pattern with mild gaseous prominence of several loops of bowel. No overt obstruction.   ASSESSMENT AND PLAN: The patient is an 81 year old, African-American male with history of diabetes, peripheral vascular disease, coronary artery disease, cerebrovascular accident, Staphylococcus bacteremia in the past, has had a cough for now with severe diarrhea.  1. Diarrhea, abdominal pain, distention, likely due to C. difficile colitis. At this time, we will get STAT stools for Clostridium difficile, start him on p.o. vancomycin. Stop his stool softeners. Abdominal distention although x-ray is nonspecific due to his distention and pain, we will get a CT of the abdomen.  2. Diabetes. We are going to start him on sliding scale, hold p.o. medications.  3. Acute renal failure. We will give him IV fluids. Hold nephrotoxins.  4. Coronary artery disease, peripheral vascular disease. Will continue aspirin.  5. Hypertension. The patient is hypotensive. We will hold his blood pressure medications.  6. Heparin for deep vein thrombosis prophylaxis.   CODE STATUS: DNR. Discussed with his wife who is at bedside. The patient was also DNR during his previous hospitalization.    TIME SPENT: 60 minutes.     ____________________________ Lacie Scotts Allena Katz, MD shp:TT D: 11/07/2013 13:30:25 ET T: 11/07/2013 14:32:50 ET JOB#: [id number]  cc: Laketia Vicknair H. Allena Katz, MD, <Dictator> Charise Carwin MD ELECTRONICALLY SIGNED 11/19/2013 8:39

## 2014-06-25 NOTE — Consult Note (Signed)
PATIENT NAME:  Melvin Obrien, Kauan L MR#:  [id number] DATE OF BIRTH:  May 05, 1924  DATE OF CONSULTATION:  11/12/2013  REFERRING PHYSICIAN:   CONSULTING PHYSICIAN:  Stann Mainlandavid P. Sampson GoonFitzgerald, MD  REQUESTING PHYSICIAN: Dr. Juliene PinaMody.   REASON FOR CONSULT:  Severe Clostridium difficile and leukocytosis.   HISTORY OF PRESENT ILLNESS: This is an 81 year old gentleman with history of CVA with left-sided hemiparesis, dementia, peripheral vascular disease, hypertension, GERD, diabetes, left above the knee amputation, blindness in the left eye, who was admitted 11/07/2013 with severe diarrhea and abdominal distention and abdominal pain. He was found to have Clostridium difficile. Of note the patient had a cough several weeks prior and had started on two courses of antibiotics. When the patient was admitted he was started on vancomycin and Flagyl. Clinically he has worsened and has been seen by GI as well as surgery. He is not a great surgical candidate. He is also been evaluated by palliative care. He has had decrease in renal function as well.   PAST MEDICAL HISTORY: CVA, prior MRSA sepsis related to foot infection, peripheral vascular disease, dementia, chronic anemia, GERD, hypertension, diabetes, left blindness in the eye, left above the knee amputation, multiple drug resistant infections in the past, status post cholecystectomy, status post multiple angioplasties, status post right metatarsal amputation.   SOCIAL HISTORY: He does not smoke or drink. He lives with his wife who cares for him.   ALLERGIES: No known drug allergies.   FAMILY HISTORY: Noncontributory.   ANTIBIOTICS SINCE ADMISSION: Include IV metronidazole begun on September 7, oral vancomycin begun on September 5 initially at 125 mg q. 6, currently at 250 mg q. 6.    REVIEW OF SYSTEMS: Unable to be obtained.   PHYSICAL EXAMINATION:  VITAL SIGNS: Temperature 98.1, pulse 98, blood pressure 124/72, respirations 18, saturation 94% on 2 liters, of note he  has been afebrile.  GENERAL: He is chronically ill appearing, lying in bed minimally communicative,  EYES:  Left eye is cloudy, right eye is reactive.  OROPHARYNX: Dry.  NECK: Supple.  HEART: Tachycardic but regular.  LUNGS: Coarse breath sounds bilaterally.  ABDOMEN: Markedly distended, tympanic, decreased bowel sounds. No obvious tenderness. EXTREMITIES: He has a left AKA.  He has bilateral edema 2 +.  NEUROLOGIC: He has left-sided weakness. He is minimally communicative on my exam.   DATA:  Microbiology, September 6 blood cultures x 2 no growth to date. Urine culture no growth and Clostridium difficile positive. Urine culture September 6 was negative on repeat. White blood count on admission was 41.7, currently it is 50.4, hemoglobin 10.0, platelets 519,000. Renal function on admission showed a BUN and creatinine of 50/2.93, currently it is 77/3.11. Imaging, CT of the abdomen done on September 6 reveals marked colitis and small volume abdominopelvic ascites, bibasilar airspace disease, favor atelectasis. Followup x-rays of the abdomen have been done, continue to show gaseous distention at the small bowel indicating an ileus although and partial small bowel obstruction cannot be ruled out.    IMPRESSION:  An 81 year old gentleman with severe Clostridium difficile complicated by an ileus, acute renal failure. He is chronically quite ill. He has been treated initially with oral vancomycin and is now on both oral vancomycin and IV Flagyl. My main concern is that the vancomycin is not  getting to the colon due to the severe colitis.   RECOMMENDATIONS:  1. Continue oral vancomycin and IV Flagyl.  2. Consider vancomycin enema. I am somewhat concerned this may worsen his abdominal distention. My understanding  is discussions were being made regarding palliative care. If the family agrees to palliative care I think an enema may be too invasive.  3. If family wants to be aggressive and have NG tube  placement then I would recommend an enema vancomycin q. 6 to help get the antibiotic directly to the colon.  4. He has very poor prognosis.  5. Thank you for the consult. I will be glad to follow with you.    ____________________________ Stann Mainland. Sampson Goon, MD dpf:bu D: 11/12/2013 14:00:01 ET T: 11/12/2013 14:23:48 ET JOB#: [id number]  cc: Stann Mainland. Sampson Goon, MD, <Dictator> Tallon Gertz Sampson Goon MD ELECTRONICALLY SIGNED 11/12/2013 15:47

## 2014-06-26 NOTE — Consult Note (Signed)
PATIENT NAME:  Melvin ProfferROGERS, Amram L MR#:  [id number] DATE OF BIRTH:  [birth year]  DATE OF CONSULTATION:  04/13/2011  REFERRING PHYSICIAN:  [facility]  CONSULTING PHYSICIAN:  Avah Bashor A. Egbert GaribaldiBird, MD PRIMARY CARE PHYSICIAN: Toy CookeyErnest Eason, MD    REASON FOR CONSULTATION: Gallstones and biliary pancreatitis.  HISTORY: The patient is an 81 year old male admitted to the [facility] on February 7th with nausea, vomiting, and abdominal pain of a couple of days' duration. The patient has a remote history of heavy alcohol use and drug use. According to his girlfriend, he has been in and out of rehabilitation services over the last many years. The patient is bedbound. He has a history of a previous CVA with left-sided hemiparesis. Work-up in the hospital has demonstrated extensive cholelithiasis and pancreatitis. It is presumed that the pancreatitis is gallstone-related. The patient currently is without pain. History is somewhat difficult to obtain from him. He does have some mild dementia. The scan of the abdomen was personally reviewed, which demonstrated very mild stranding seen within the pancreas. Furthermore, the patient has a history of previous prostate cancer treated with brachytherapy seeds. In the pelvis, there is circumferential bladder wall thickening and heterogeneous lucency and sclerosis involving the bones of the right hemipelvis. An ultrasound was obtained which demonstrated a 6 mm bile duct and extensive cholelithiasis without any evidence of gallbladder wall thickening or pericholecystic fluid. Currently the patient is without any pain; however, he is yet to tolerate any liquids or regular food since being hospitalized. Surgical Services were consulted for the consideration of cholecystectomy.   ALLERGIES: None.   HOME MEDICATIONS: Prilosec, multivitamin, Aggrenox, glimepiride, amlodipine, benazepril, olanzapine.   HOSPITALIZED MEDICATIONS: Colace, Invanz, Protonix, aspirin, sliding scale  insulin, Thorazine.   PAST MEDICAL HISTORY:  1. Hypertension.  2. Cerebrovascular accident.  3. Gastroesophageal reflux disease.  4. Diabetes. 5. Mild dementia.  6. Legal blindness, left eye.   PAST SURGICAL HISTORY: None.   SOCIAL HISTORY: The patient lives at home with his girlfriend. He used to smoke about a pack a day. He quit smoking two years ago. Heavy alcohol use in the past, however, admits to recent drinking prior to his admission to the hospital.   FAMILY HISTORY: Noncontributory as it is unobtainable.   REVIEW OF SYSTEMS: Significant for legal blindness, left eye, cataract in the left eye. No chest pain. No shortness of breath. GI is significant for nausea, vomiting. No diarrhea. Significant for abdominal pain. No hematemesis. No fever. No scleral icterus. The remaining 10-point review is unremarkable as can be obtained from the patient as well as the chart.   PHYSICAL EXAMINATION:  VITAL SIGNS: Temperature is 98.3, pulse is 90, respiratory rate of 20, blood pressure is 118/73, blood sugar is 125 to 138. Affect is appropriate.   HEENT: The patient is hard of hearing. He has a cataract in his left eye. He has left-sided hemiparesis.   NECK: Supple. No adenopathy or thyromegaly.   LUNGS: Clear.   HEART: Regular rate and rhythm.   ABDOMEN: Soft, nontender. No hernias, no scars, no masses, no hepatomegaly. No evidence of caput medusa.   RECTAL/GU: Examination deferred.  EXTREMITIES: Warm and well perfused. There is left-sided hemiparesis of left upper extremity, left lower extremity.    LABORATORY, DIAGNOSTIC AND RADIOLOGICAL DATA:  Admission lipase greater than 3000. Amylase was 898 on admission. Today lipase is 732., AST on admission 160, ALT on admission 262, today is 25 and 108. Total bilirubin on admission 1.2, today 0.5.  White count at admission 13.1, today 13.9. Hemoglobin 11.6, hematocrit 36, platelet count 287,000.  Pro time not obtained.  Review of x-rays:  CT scan and ultrasound were personally reviewed on the PACS monitor.  IMPRESSION: The patient is an 81 year old bedbound male with cholelithiasis and biliary pancreatitis. He has a significant history of alcohol abuse and dependence in the past, history of tobacco abuse and dependence in the past, history of drug abuse and dependence in the past, as well as a bedbound state, left hemiparesis. He is at moderate risk for surgical intervention due to his medical morbidities.   RECOMMENDATIONS: At present, I would advance his diet to clear liquids as he is not having any pain. Once his lipase has normalized and he can tolerate, elective cholecystectomy with cholangiography should be entertained. At present, there is sorting out of [employer] of [place], and once that occurs we can proceed with elective cholecystectomy either as an inpatient or as an outpatient.   This care plan was discussed with the nurse, family, and his girlfriend at the bedside.   TOTAL TIME SPENT: 60 minutes.   ____________________________ Redge Gainer Egbert Garibaldi, MD mab:cbb D: 04/13/2011 13:07:35 ET T: 04/13/2011 13:24:58 ET JOB#: [id number]  cc: Loraine Leriche A. Egbert Garibaldi, MD, <Dictator> Serita Sheller. Maryellen Pile, MD Raynald Kemp MD ELECTRONICALLY SIGNED 04/15/2011 17:06

## 2014-06-26 NOTE — H&P (Signed)
PATIENT NAME:  Melvin Obrien, Melvin Obrien MR#:  [id number] DATE OF BIRTH:  [birth year]  DATE OF ADMISSION:  04/11/2011  ADMITTING PHYSICIAN: Dr. Enid Baasadhika Satya Buttram  PRIMARY CARE PHYSICIAN: Dr. Maryellen PileEason   CHIEF COMPLAINT: Nausea, vomiting, abdominal pain.   HISTORY OF PRESENT ILLNESS: Mr. Melvin Obrien is an 81 year old elderly African American male with past medical history significant for history of severe alcohol abuse, currently cut down, history of cerebrovascular accident with left-sided hemiparesis in 07/2009, hypertension, and diabetes was brought in from home by wife with abdominal pain, nausea, vomiting for one day. Patient's wife is the primary historian as patient has hiccups and also he is very hard of hearing. According to patient's wife patient has been doing okay eating his food normally up until this morning. Last night he did complain of some fullness in the stomach, did not want to eat any dinner so she gave him some Ensure and this morning at 4:30 a.m. she heard him vomiting. He did complain of generalized abdominal pain with ongoing nausea and also a few episodes of vomiting. Since last Monday, which was three days ago, he started having hiccups that would get better by drinking some ginger ale but today the hiccups have been intractable. She denies any fevers, chills, cold, cough, congestion, or recent illness. Last week he was on an antibiotic for possible bronchitis from what she describes but she does not know what antibiotic he has been on. In the ER he was found to have elevated lipase of greater than 3000 and elevated LFTs and also white count of 13.1. His ultrasound of the abdomen shows extensive cholelithiasis. He is awaiting a CT of his abdomen. He is being admitted for acute gallstone-induced pancreatitis.   PAST MEDICAL HISTORY:  1. CVA with mild left-sided residual hemiparesis.  2. Gastroesophageal reflux disease.  3. Hypertension.  4. Diabetes mellitus.  5. Mild dementia.  6. Legally  blind in his left eye.   PAST SURGICAL HISTORY: None.   ALLERGIES: No known drug allergies.   MEDICATIONS AT HOME:  1. Prilosec 20 mg p.o. b.i.d.  2. Multivitamin 1 tablet p.o. daily.  3. Aggrenox 25/200, 1 tablet p.o. b.i.d.  4. Glimepiride 1 mg p.o. daily.  5. Amlodipine/benazepril 5/20 mg 1 tablet p.o. daily.  6. Olanzapine 5 mg p.o. at bedtime.   SOCIAL HISTORY: Lives at home with wife. Used to smoke about a pack per day. Quit smoking about two years ago. He used to drink a lot but wife says he has cut down and drinking a beer once in a while. His alcohol level is negative in the ED.   FAMILY HISTORY: Patient says he does not know much about his parents but his mom died in her 4660s.    REVIEW OF SYSTEMS: CONSTITUTIONAL: No fever, fatigue, or weakness. EYES: Positive for cataract in the right eye. Positive for legal blindness in the left eye. ENT: No tinnitus, ear pain. Positive for hearing loss. No epistaxis or discharge. Positive for dysphagia since his stroke. RESPIRATORY: No cough, wheeze, hemoptysis, or chronic obstructive pulmonary disease. CARDIOVASCULAR: No chest pain, orthopnea, edema, arrhythmia, palpitations or syncope. GASTROINTESTINAL: Positive for nausea and vomiting. No diarrhea. Positive for abdominal pain. No hematemesis or rectal bleed. GENITOURINARY: No dysuria, hematuria, renal calculus, or frequency. ENDOCRINE: No polyuria, nocturia, thyroid problems, heat or cold intolerance. HEMATOLOGY: No anemia, easy bruising or bleeding. SKIN: No acne, rash, or lesions. MUSCULOSKELETAL: No neck, back, shoulder pain, arthritis, or gout. NEUROLOGIC: No numbness, weakness, cerebrovascular accident, transient ischemic  attack, or seizures. PSYCHOLOGIC: no anxiety, insomnia, or depression.   PHYSICAL EXAMINATION:  VITAL SIGNS: Temperature 98.2 degrees Fahrenheit, pulse 99, respirations 18, blood pressure 140/73, pulse oximetry 98% on room air.   GENERAL: Well built, well nourished male  with intractable hiccups.    HEENT: Normocephalic, atraumatic. Right eye pupil is 2 mm with sluggish reaction to light and left leg cornea is opacified. Oropharynx clear without erythema, mass or exudates. Dry mucous membranes.   NECK: Supple. No thyromegaly, JVD or carotid bruits. No lymphadenopathy.   LUNGS: He is moving air bilaterally but has diminished breath sounds bibasilar secondary to poor inspiratory effort. No wheeze or crackles heard.   CARDIOVASCULAR: S1, S2 regular rate and rhythm. No murmurs, rubs, or gallops.   ABDOMEN: Soft and distended with minimal generalized tenderness. No guarding or rigidity. Hypoactive bowel sounds.   EXTREMITIES: No pedal edema. No clubbing or cyanosis. 2+ dorsalis pedis pulses palpable bilaterally.   SKIN: No acne, rash, or lesions.   LYMPHATICS: No cervical lymphadenopathy.   NEUROLOGIC: Cranial nerves intact. He is weaker on the left side, about 3/5 strength. He is baseline wheelchair bound.   PSYCHOLOGIC: He is alert and awake. Unable to test orientation because he is having hiccups but is oriented to person and place.   LABORATORY, DIAGNOSTIC AND RADIOLOGICAL DATA: WBC 13.1, hemoglobin 13.6, hematocrit 41.7, platelet count 363, sodium 136, potassium 4.3, chloride 100, bicarbonate 27, BUN 21, creatinine 1.41, glucose 215, calcium 8.6.  ALT 262, AST 160, alkaline phosphatase 223, total bilirubin 1.2, albumin 2.7, amylase elevated at 898 and lipase greater than 3000, lactic acid elevated at 3. Alcohol level is negative. Abdominal x-ray showing nonobstructive bowel pattern and the right acetabulum and inferior pubic ramus appear heterogeneous and relatively lucent. Etiology such as metastatic disease are not completely excluded. Urinalysis negative for any infection. Abdominal ultrasound shows extensive cholelithiasis. No evidence of acute cholecystitis and the common bile duct measures about 6 mm.    ASSESSMENT AND PLAN: This is an 81 year old  man with past history of alcohol abuse, history of cerebrovascular accident, hypertension, diabetes is being admitted for acute gallstone-induced pancreatitis and also intractable hiccups.  1. Acute pancreatitis likely gallstone induced. He has history of alcohol abuse but has cut down now. His alcohol level is negative. Will admit to regular Med/Surg floor. Place him on IV fluids. Keep him n.p.o. Give him Zofran and morphine p.r.n. Surgical and GI consults and he is getting a CT of the abdomen for better evaluation of pancreas. It will be done without contrast with his elevated renal function. His white count is elevated so will do some blood cultures and order Invanz just in case if he has cholangitis. He is afebrile so far now. Follow up repeat lipase level in the morning.  2. Acute renal failure, likely prerenal. Will give IV fluids and follow up in the morning. Avoid nephrotoxins. Hold his benazepril.   3. Elevated transaminases likely from acute pancreatitis with gallstones, possible obstruction. CBD is 6 mm on the ultrasound. Will get a CT of the abdomen. Follow up hepatitis profile and also GI recommendations.  4. Hypertension. Since he is strictly n.p.o. hold his blood pressure medications and restart them once he can take p.o. 5. Hiccups, probably from the liver disease. Will place him on Thorazine IV p.r.n.  6. History of cerebrovascular accident. Give rectal aspirin as he is currently n.p.o.  7. GI and DVT prophylaxis. On IV Protonix and also TED stockings.  8. CODE STATUS  of the patient. I discussed with wife at bedside and the wife wants him to be DO NOT RESUSCITATE and patient agrees with that, however, they have poor insight into his medical illness, does not understand how sick he is because wife wants to take him home. The wife said just give him something for hiccups and then we will take him home and he can see his primary care physician.  9. Poor prognosis and also will place a  palliative care consult.   TOTAL TIME SPENT: 50 minutes.   ____________________________ Enid Baas, MD rk:cms D: 04/11/2011 19:53:47 ET T: 04/12/2011 05:48:54 ET JOB#: [id number]  cc: Enid Baas, MD, <Dictator> Serita Sheller. Maryellen Pile, MD Enid Baas MD ELECTRONICALLY SIGNED 04/26/2011 15:53

## 2014-06-26 NOTE — Op Note (Signed)
PATIENT NAME:  Melvin Obrien, Melvin Obrien MR#:  [id number] DATE OF BIRTH:  [birth year]  DATE OF PROCEDURE:  06/17/2011  PREOPERATIVE DIAGNOSES:  1. Peripheral arterial disease with ulceration, right lower extremity.  2. Stroke with left-sided weakness.  3. Prostate cancer.  4. Hypertension.   POSTOPERATIVE DIAGNOSES:  1. Peripheral arterial disease with ulceration, right lower extremity.  2. Stroke with left-sided weakness.  3. Prostate cancer.  4. Hypertension.   PROCEDURES PERFORMED: 1. Catheter placement into right peroneal artery from left femoral approach.  2. Aortogram and selective right lower extremity angiogram.  3. Angioplasty of distal right popliteal and tibioperoneal trunk with 5 mm diameter angioplasty balloon.  4. Percutaneous transluminal angioplasty of right proximal superficial femoral artery with 6 mm diameter angioplasty balloon.  5. Percutaneous transluminal angioplasty of right common iliac artery with 9 mm diameter angioplasty balloon.  6. StarClose closure device, left femoral artery.   SURGEON: Annice NeedyJason S. Denilson Salminen, M.D.   ANESTHESIA: Local with moderate conscious sedation.   ESTIMATED BLOOD LOSS: Minimal.   CONTRAST USED: 68 mL Visipaque.   FLUORO TIME: 3.2 minutes.   INDICATION FOR PROCEDURE: This is an 81 year old African American male with a nonhealing ulceration of the right lower extremity and peripheral vascular disease. He is brought in for angiogram for further evaluation and possible treatment to increase his likelihood of limb salvage. Risks and benefits were discussed. Informed consent was obtained.   DESCRIPTION OF PROCEDURE: The patient was brought to the vascular interventional radiology suite. Groins were shaved and prepped and a sterile surgical field was created. The left femoral head was localized with fluoroscopy and the left femoral artery was accessed without difficulty with a Seldinger needle. A 3J wire and 5 French sheath was placed. A pigtail catheter  was placed in the aorta at the L1 level and AP aortogram was performed. This showed two renal vessels with some stenosis of the small superior polar renal vessel on the left and the two left renal vessels with stenosis of the small left polar superior renal vessel on the left. The right renal artery was patent. There was an approximately 50 to 55% stenosis of the proximal right common iliac artery. Despite this I was able to cross the aortic bifurcation without difficulty as this was a generous size. The pigtail catheter was then parked at the common femoral level and right lower extremity angiogram was then performed. This showed an approximately 60% stenosis at the origin of the superficial femoral artery. The remainder of the superficial femoral artery was quite calcified, but not stenotic. The distal popliteal artery into the tibioperoneal trunk had a very high-grade stenosis in the greater than 90% range. The peroneal artery was the runoff distally. The anterior tibial artery was patent for the first few centimeters and then occluded. The posterior tibial artery was chronically occluded. The patient was given 3500 units of intravenous heparin for systemic anticoagulation. A Terumo Advantage wire was used to cross the lesions. A Kumpe catheter was placed in the peroneal artery and intraluminal flow was confirmed. I then replaced the 0.035 wire. The vessels were all very large size and despite being down to the tibial vessels they measured almost 5 mm in diameter and so a 5 mm diameter angioplasty balloon was inflated in the distal popliteal artery and tibioperoneal trunk with a tight waste taken which resolved with inflation and completion angiogram now showed this area to be patent without significant residual stenosis or dissection. The proximal superficial femoral artery was treated with  a 6 mm diameter angioplasty balloon. There was still an approximately 30 to 40% residual stenosis, but at this point in an  area where we could not place a stent I did not want to be aggressive and balloon larger. This did not appear hemodynamically significant. The sheath was then pulled back to the aortic bifurcation and magnified view of the iliac lesion was seen and this was treated with a 9 mm diameter angioplasty balloon. Similar to the origin of the superficial femoral artery there was still some residual stenosis, but this was not flow limiting and again appeared to be in the 30 to 40% range. At this point, I also did not want to place a stent in our current configuration and the area was not flow-limiting so I pulled the sheath back to the     ipsilateral iliac artery and oblique arteriogram was performed. StarClose closure device was deployed in the usual fashion with excellent hemostatic result. The patient tolerated the procedure well and was taken to the recovery room in stable condition.  ____________________________ Annice Needy, MD jsd:slb D: 06/17/2011 12:15:38 ET T: 06/17/2011 15:42:50 ET JOB#: [id number]  cc: Annice Needy, MD, <Dictator> Serita Sheller. Maryellen Pile, MD Annice Needy MD ELECTRONICALLY SIGNED 06/24/2011 9:53

## 2014-06-26 NOTE — Consult Note (Signed)
Brief Consult Note: Diagnosis: gallstone pancreatitis.   Patient was seen by consultant.   Consult note dictated.   Discussed with Attending MD.   Comments: Appreciate consult for 81 y/o [place] man for gallstone pancreatitis. Agree with current and surgical consult. Would repeat labs in am and consider MRCP for tomorrow.  Electronic Signatures: Vevelyn PatLondon, Christiane H (NP)  (Signed 08-Feb-13 14:49)  Authored: Brief Consult Note   Last Updated: 08-Feb-13 14:49 by Keturah BarreLondon, Christiane H (NP)

## 2014-06-26 NOTE — Op Note (Signed)
PATIENT NAME:  Melvin Obrien, Melvin Obrien MR#:  [id number] DATE OF BIRTH:  Dec 31, 1924  DATE OF PROCEDURE:  04/16/2011  PREOPERATIVE DIAGNOSIS: Biliary pancreatitis, cholelithiasis.   POSTOPERATIVE DIAGNOSIS: Biliary pancreatitis, cholelithiasis.   PROCEDURES PERFORMED: Laparoscopic cholecystectomy with intraoperative C-arm fluoroscopic cholangiography.   SURGEON: Natale LayMark Lars Jeziorski, M.D.   ANESTHESIA: General endotracheal.   SPECIMENS: Gallbladder with contents.   FINDINGS: Stones, normal cholangiogram.   DESCRIPTION OF PROCEDURE: With the patient in the supine position, general endotracheal anesthesia was induced. The patient's left arm was padded and tucked at his side. His abdomen was widely prepped and draped utilizing ChloraPrep solution. Surgical time out was obtained.   A 12 mm blunt Hassan trocar was placed through an infraumbilical transversely oriented skin incision with stay sutures passed through the fascia. Pneumoperitoneum was established. Limited laparoscopic evaluation of the abdomen demonstrated a normal-appearing liver. The gallbladder was distended. The stomach appeared to be normal, dilated with air, and an oral gastric tube was passed by anesthesia. The patient was then positioned in reverse Trendelenburg and airplane right side up. A 5 mm Bladeless trocar was placed in the epigastrium. Two 5 mm bladeless trocars were placed in the right subcostal margin lateral to the rectus sheath. The gallbladder was decompressed of thick bile with an aspiration cannula. It was grasped along its fundus and elevated towards the right shoulder. Lateral traction was placed on Hartmann's pouch. The peritoneum overlying the hepatoduodenal ligament was incised utilizing blunt technique. A long cystic duct was identified. A single branch of the cystic artery was identified, doubly clipped on the portal side, singly clipped on the gallbladder side, and divided. I first attempted to perform cholangiography with a  Kumar clamp, however, this was unsuccessful due to the anatomy of the gallbladder cystic duct junction. As such an Arrow cholangiogram catheter was brought onto the field. A percutaneous sheath was placed through a stab incision in the right upper quadrant. A clip was placed on the gallbladder side of the cystic duct. A transverse incision was fashioned in the cystic duct with return of free bile. The cholangiogram catheter was then placed directly into the cystic duct and the balloon was used for occlusion. No leak was encountered. Fluoroscopy was then performed. There was free flow of bile into the duodenum. Intrahepatic biliary radicals including the right and left hepatic ducts were identified and the common bile duct appeared to be normal. There was no filling defects.   The cholangiogram catheter was then removed. The cystic duct was triply clipped on the portal side and divided. The gallbladder was then retrieved off the gallbladder fossa and one small vein was divided between hemoclips. The gallbladder was then retrieved off the gallbladder fossa with some spillage of bile which was immediately aspirated. It was placed into an Endo Catch device and retrieved. The right upper quadrant was then copiously irrigated with a total of 2 liters of normal saline and aspirated dry. Surgiflo with thrombin application was placed in the gallbladder fossa for hemostasis. The ports were then removed under direct visualization with no evidence of bowel injury.   The infraumbilical fascial defect was easily reapproximated utilizing a figure-of-eight #0 Vicryl suture in vertical orientation. The existing stay sutures were tied to each other. A total of 30 mL of 0.25% plain Marcaine was instilled prior to closure. 4-0 nylons were used to reapproximate skin edges in simple and vertical mattress orientation. Sterile occlusive dressings were then placed, and the patient was subsequently extubated and taken to the Recovery  Room in stable and satisfactory condition by anesthesia services. ____________________________ Melvin Gainer Egbert Garibaldi, MD mab:slb D: 04/17/2011 09:16:00 ET T: 04/17/2011 09:57:23 ET JOB#: [id number]  cc: Serita Sheller. Maryellen Pile, MD Melvin Gainer Egbert Garibaldi, MD, <Dictator> Melvin Kemp MD ELECTRONICALLY SIGNED 04/18/2011 14:31

## 2014-06-26 NOTE — Consult Note (Signed)
Chief Complaint:   Subjective/Chief Complaint patietn had bm and passing flatus.  continues with some epigastric and luq discomfort.   VITAL SIGNS/ANCILLARY NOTES: **Vital Signs.:   10-Feb-13 13:18   Vital Signs Type Routine   Temperature Temperature (F) 98.8   Celsius 37.1   Temperature Source oral   Pulse Pulse 95   Respirations Respirations 18   Systolic BP Systolic BP 127   Diastolic BP (mmHg) Diastolic BP (mmHg) 77   Mean BP 93   BP Source Dinamap   Pulse Ox % Pulse Ox % 95  *Intake and Output.:   10-Feb-13 09:27   Stool  Pt had a medium sized semi loose/formed stool    14:48   Stool  1 large loose brown incont.   Brief Assessment:   Cardiac Regular    Respiratory clear BS    Gastrointestinal details normal Soft  No masses palpable  Bowel sounds normal  No rebound tenderness  No gaurding  mild epigastric firmness, mild tenderness     Routine Chem:  07-Feb-13 16:13    Lipase > 3000  08-Feb-13 04:43    Lipase 2600  09-Feb-13 06:28    Lipase 732  Blood Glucose:  10-Feb-13 07:01    POCT Blood Glucose 75  Routine Chem:  10-Feb-13 07:26    Lipase 388  Hepatic:  10-Feb-13 07:26    Bilirubin, Total 0.5   Bilirubin, Direct 0.2   Alkaline Phosphatase 140   SGPT (ALT) 85   SGOT (AST) 20   Total Protein, Serum 7.4   Albumin, Serum 3.0  Blood Glucose:  10-Feb-13 11:29    POCT Blood Glucose 189    16:19    POCT Blood Glucose 141   Assessment/Plan:  Assessment/Plan:   Assessment 1) biliary pancreatitis, improving, lipase now normalized.    Plan 1) ccy when clinically feasible. continue current.   Electronic Signatures: Barnetta ChapelSkulskie, Shatonya Passon (MD)  (Signed 10-Feb-13 18:10)  Authored: Chief Complaint, VITAL SIGNS/ANCILLARY NOTES, Brief Assessment, Lab Results, Assessment/Plan   Last Updated: 10-Feb-13 18:10 by Barnetta ChapelSkulskie, Shalandria Elsbernd (MD)

## 2014-06-26 NOTE — Consult Note (Signed)
PATIENT NAME:  Melvin Obrien, Melvin Obrien MR#:  [id number] DATE OF BIRTH:  1924/12/19  DATE OF CONSULTATION:  04/12/2011  REFERRING PHYSICIAN:  Enid Baasadhika Kalisetti, MD  CONSULTING PHYSICIAN:  Melvin Barrehristiane H. Emillee Talsma, NP  PRIMARY CARE PHYSICIAN: Melvin Obrien   HISTORY OF PRESENT ILLNESS: Melvin Obrien is an 81 year old elderly African American man who was admitted yesterday for nausea, vomiting, and abdominal pain. His past medical history is significant for alcohol abuse, cerebrovascular accident with left-sided hemiparesis, hypertension, and diabetes. Please see full History and Physical for admission details.  His wife is not present at the bedside right now. The patient is somewhat drowsy and confused. Some of the information I have obtained is from the History and Physical. GI has been consulted due to Melvin Obrien's request to evaluate his gallstone pancreatitis. Evidently he has extensive cholelithiasis in his gallbladder. His alkaline phosphatase, bilirubin, and other liver enzymes have been somewhat elevated. His lipase was found to be greater than 3000 on admission and he was having abdominal pain, nausea, and vomiting. Since admission his vomiting and hiccups have improved.  His lipase has come down some.  His liver panel is beginning to improve with his alkaline phosphatase decreasing to 169, AST 77, ALT 198. He does have a small amount of leukocytosis. His hemoglobin and platelets are stable. Kidneys have improved with GFR greater than 60, creatinine of 1.08. Evidently the patient was brought to the Emergency Department with a one-day history of abdominal pain, nausea, vomiting, hiccups, and a feeling of abdominal fullness without any other associated symptoms such as fevers, chills, cold, cough, congestion. He was on an antibiotic fairly recently. However, he has had no diarrhea, blood in the stool, or hematemesis. He thinks he has never had a colonoscopy or an EGD to this date. He has reported to have cut his  alcohol drinking down significantly, last use was four or five days ago according to the chart note. Today he says he feels a little better. He is glad his hiccups are gone but his abdomen does continue to hurt. It appears there has been no further vomiting.   PAST MEDICAL HISTORY:  1. Cerebrovascular accident with left-sided residual hemiparesis.  2. Gastroesophageal reflux.  3. Hypertension.  4. Diabetes.  5. Mild dementia.  6. Left eye blindness.   PAST SURGICAL HISTORY: None.   ALLERGIES: No known drug allergies.   MEDICATIONS:  1. Prilosec 20 mg p.o. b.i.d.  2. MVI 1 tab p.o. daily.  3. Aggrenox 25/200, 1 tablet p.o. b.i.d.  4. Glimepiride 1 mg p.o. daily.  5. Amlodipine benazepril 5/20 mg 1 tablet p.o. daily.  6. Olanzapine 5 mg p.o. at bedtime.   SOCIAL HISTORY: Lives at home with wife, does have history of cigarette smoking, quit two years ago. Per History and Physical, wife states he has cut down and drinks beer once in a while. Alcohol level was 3 in the ED.   FAMILY HISTORY: Unknown.  REVIEW OF SYSTEMS: Per the History and Physical: CONSTITUTIONAL: No fever, fatigue, or weakness. EYES: Cataract right eye, legal blindness left eye. ENT: No tinnitus or ear pain. He is hard of hearing. No nosebleeds or drainage. Has difficulty swallowing since CVA. RESPIRATORY: No cough, wheeze, hemoptysis, or chronic obstructive pulmonary disease.  CARDIOVASCULAR: No chest pain, orthopnea, edema, arrhythmia, palpitations, or syncope.  GI: As noted. GU: No dysuria, hematuria, kidney stones, or frequency. ENDOCRINE: No polyuria, nocturia, thyroid issues, or temperature intolerances. HEMATOLOGY: No anemia, easy bruising, or bleeding. SKIN: No erythema, lesion,  or rash. MUSCULOSKELETAL: Does have decreased use of the left arm and leg. No other issues. NEUROLOGIC: Does have history of CVA and left-sided weakness. No numbness or seizures. PSYCH: No history of anxiety, insomnia, or depression.    LABS/STUDIES: Most recent lab work: Serum glucose 127, BUN 21, creatinine 1.08, serum sodium 140, potassium 4.6, chloride 106, GFR greater than 60, calcium 8.9, magnesium 2.5, lipase 2600, total protein 7.1, serum albumin 3.3, total bilirubin 0.7, direct bilirubin 0.1, alkaline phosphatase 169, AST 77, ALT 198, WBC 16.2, hemoglobin 12.7, hematocrit 39.3, platelet count 313, normocytosis. Urinalysis negative for urinary tract infection. Three-way abdomen with nonobstructive bowel gas pattern, questionable heterogenous lucencies to the pelvic bones. Noncontrasted CT with suggestion of stranding around the pancreas. Hyperdensities in the gallbladder consistent with extensive stones seen on ultrasound. Bladder wall thickening. Atelectasis to the left lower lobe. Heterogenous lucencies of the bones of the right hemipelvis as noted on x-ray. Abdominal ultrasound without gallbladder wall thickening or hepatobiliary ductal dilation. Does show extensive cholelithiasis.   PHYSICAL EXAMINATION:  VITAL SIGNS: Most recent vital signs: Temperature 98, pulse 82, respiratory rate 18, blood pressure 110/62, oxygen saturation 96%. Fingerstick was 103.   GENERAL: Sleepy, elderly African American male lying in bed, appears comfortable, in no acute distress, easily arousable.   PSYCH: Pleasant, cooperative. Mood is stable. Somewhat disoriented.   HEENT: Normocephalic, atraumatic. Left cornea is opacified. No redness, drainage, or inflammation to the eyes or nares. Oral membranes are pink and moist.   NECK: No thyromegaly, JVD, or lymphadenopathy. Supple.    RESPIRATORY: Respirations eupneic. Lungs clear to auscultation and percussion.  CARDIOVASCULAR: S1, S2, regular rate and rhythm. No murmurs, rubs, or gallops. Peripheral pulses 2+ bilaterally.   ABDOMEN: Slightly distended, active bowel sounds times four.  Some generalized tenderness. No guarding. No rigidity. No rebound tenderness, peritoneal signs,  hepatosplenomegaly, or hernias.   RECTAL: Deferred.   GU: Deferred.   EXTREMITIES: No clubbing, cyanosis, or edema. Great difficulty moving left leg. Able to move the left arm, weaker than right. No issues moving right arm or left arm. Strength five out of five to these extremities.   SKIN: No erythema, lesion, or rash.   NEUROLOGICAL: Alert, oriented times two. Cranial nerves appear to be mostly intact. However, due to left cornea opacification this is difficult to appreciate given cranial nerve II and his loss of hearing. He is not wearing his dentures at present so his speech is somewhat unclear, however understandable. No facial droop. Sensation appears to be intact.   IMPRESSION AND PLAN: Pancreatitis likely related to gallstones. Consider ordering M.R.C.P. in the morning. Additionally would repeat his labs in the morning. Continue with current. Agree with surgical consult.   These services were provided by Vevelyn Pat, MSN, NPC in collaboration with Barnetta Chapel, MD.     ____________________________ Melvin Barre, NP chl:bjt D: 04/12/2011 14:47:09 ET T: 04/12/2011 15:14:15 ET JOB#: [id number]  cc: Melvin Barre, NP, <Dictator> Eustaquio Maize Honey Zakarian FNP ELECTRONICALLY SIGNED 04/18/2011 21:15

## 2014-06-26 NOTE — Discharge Summary (Signed)
PATIENT NAME:  Melvin Obrien, Melvin Obrien MR#:  [id number] DATE OF BIRTH:  03/07/1924  DATE OF ADMISSION:  04/11/2011 DATE OF DISCHARGE:  04/21/2011  FOR PREVIOUS HOSPITAL COURSE PLEASE SEE THE INTERIM DISCHARGE SUMMARY DONE ON 04/19/2011 BY DR. Luberta MutterKONIDENA. THIS DICTATION SHOULD COVER 04/20/2011 AND 04/21/2011.   DISCHARGE DIAGNOSES:  1. Abdominal pain, nausea, vomiting secondary to biliary pancreatitis, resolved.  2. Cholelithiasis status post laparoscopic cholecystectomy. 3. Hypertension. 4. Diabetes type 2. 5. History of stroke with left-sided residual weakness.  6. History of gastroesophageal reflux disease. 7. 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 8. Ileus.   DISCHARGE MEDICATIONS:  1. Omeprazole 20 mg b.i.d.  2. Lotrel 5/20 mg once a day. 3. Olanzapine 5 mg at bedtime. 4. Amaryl 1 mg daily.  5. Aggrenox 1 tab 2 times a day.  6. Multivitamin 1 tab daily.  7. Travatan Z 0.004% ophthalmic, 1 drop to each affected eye the right in the evening.  8. Colace 100 mg b.i.d.  9. Thorazine 10 mg every eight hours as needed for intractable hiccups x6 tablets.   DIET: Low sodium, ADA diabetic diet.   ACTIVITY: As tolerated.   DISPOSITION: Patient will be going home with home services including PT, R.N.   FOLLOW UP: Please follow up with surgery, Dr. Natale LayMark Bird, within two weeks as well as Dr. Maryellen PileEason, your primary care physician.   HOSPITAL COURSE: Since 02/15 patient has been in the hospital for evaluation and monitoring of the ileus. Patient was going to be discharged on the 15th, however, patient was noted to have some distention with nausea. X-ray of the abdomen was done showing possible ileus. This was likely in the setting of pancreatitis. He was started on bowel regimen. He has since then been having bowel movements and is tolerating his p.o. His hiccups have improved. He has some mild abdominal pain from the laparoscopic cholecystectomy, however, states pain is well controlled. His ileus has resolved  on subsequent imaging. He is okay to be discharged from surgical standpoint. In regards to his diabetes medicine, he will be going home with his Amaryl. Also he will be going home with his outpatient hypertension medications. Home services including PT and R.N. have been arranged and he will be going home today.   CODE STATUS: FULL CODE.   TOTAL TIME SPENT: 35 minutes.  ____________________________ Krystal EatonShayiq Tracy Gerken, MD sa:cms D: 04/21/2011 15:11:56 ET T: 04/22/2011 07:34:04 ET JOB#: [id number]  cc: Krystal EatonShayiq Aneliz Carbary, MD, <Dictator> Serita ShellerErnest B. Maryellen PileEason, MD Krystal EatonSHAYIQ Darrah Dredge MD ELECTRONICALLY SIGNED 04/26/2011 15:26

## 2014-06-26 NOTE — Consult Note (Signed)
Chief Complaint:   Subjective/Chief Complaint Patient seen and examined, please see full GI consult, following.   VITAL SIGNS/ANCILLARY NOTES: **Vital Signs.:   08-Feb-13 17:17   Vital Signs Type Q 4hr   Temperature Temperature (F) 97.6   Celsius 36.4   Pulse Pulse 96   Respirations Respirations 18   Systolic BP Systolic BP 117   Diastolic BP (mmHg) Diastolic BP (mmHg) 74   Mean BP 88   Pulse Ox % Pulse Ox % 96   Pulse Ox Activity Level  At rest   Oxygen Delivery Room Air/ 21 %   Electronic Signatures: Barnetta ChapelSkulskie, Martin (MD)  (Signed 08-Feb-13 22:20)  Authored: Chief Complaint, VITAL SIGNS/ANCILLARY NOTES   Last Updated: 08-Feb-13 22:20 by Barnetta ChapelSkulskie, Martin (MD)

## 2014-06-26 NOTE — Consult Note (Signed)
Chief Complaint:   Subjective/Chief Complaint denies abdominal pain, tolerating clears.   VITAL SIGNS/ANCILLARY NOTES: **Vital Signs.:   11-Feb-13 14:30   Vital Signs Type Routine   Temperature Temperature (F) 98.3   Celsius 36.8   Temperature Source oral   Pulse Pulse 72   Pulse source per Dinamap   Respirations Respirations 18   Systolic BP Systolic BP 126   Diastolic BP (mmHg) Diastolic BP (mmHg) 74   Mean BP 91   BP Source Dinamap   Pulse Ox % Pulse Ox % 96   Pulse Ox Activity Level  At rest   Oxygen Delivery Room Air/ 21 %   Brief Assessment:   Cardiac Regular    Respiratory clear BS    Gastrointestinal details normal Soft  Nondistended  Bowel sounds normal  No rebound tenderness  tender to palpation in the epigastrum   Blood Glucose:  11-Feb-13 07:18    POCT Blood Glucose 141    11:17    POCT Blood Glucose 142  Routine Hem:  11-Feb-13 12:03    WBC (CBC) 7.7   RBC (CBC) 3.62   Hemoglobin (CBC) 11.2   Hematocrit (CBC) 34.0   Platelet Count (CBC) 326   MCV 94   MCH 30.9   MCHC 33.0   RDW 12.7  Hepatic:  11-Feb-13 12:03    Bilirubin, Total 0.4   Alkaline Phosphatase 116   SGPT (ALT) 56   SGOT (AST) 17   Total Protein, Serum 6.9   Albumin, Serum 2.7  Routine Hem:  11-Feb-13 12:03    Neutrophil % 68.6   Lymphocyte % 17.0   Monocyte % 9.1   Eosinophil % 4.3   Basophil % 1.0   Neutrophil # 5.3   Lymphocyte # 1.3   Monocyte # 0.7   Eosinophil # 0.3   Basophil # 0.1  Hepatic:  11-Feb-13 12:03    Bilirubin, Direct < 0.1  Blood Glucose:  11-Feb-13 16:16    POCT Blood Glucose 158   Assessment/Plan:  Assessment/Plan:   Assessment 1)  biliary pancreatitis-improved    Plan 1) agree with ccy/ioc when clinically feasible   Electronic Signatures: Barnetta ChapelSkulskie, Cailin Gebel (MD)  (Signed 11-Feb-13 20:21)  Authored: Chief Complaint, VITAL SIGNS/ANCILLARY NOTES, Brief Assessment, Lab Results, Assessment/Plan   Last Updated: 11-Feb-13 20:21 by Barnetta ChapelSkulskie,  Henleigh Robello (MD)

## 2014-06-26 NOTE — Op Note (Signed)
PATIENT NAME:  Melvin Obrien, Melvin Obrien MR#:  Obrien DATE OF BIRTH:  19-May-1924  DATE OF PROCEDURE:  09/16/2011  PREOPERATIVE DIAGNOSES:  1. Peripheral arterial disease with ulceration, right lower extremity.  2. Stroke.  3. Diabetes.  4. Hypertension.   POSTOPERATIVE DIAGNOSES:  1. Peripheral arterial disease with ulceration, right lower extremity.  2. Stroke.  3. Diabetes.  4. Hypertension.   PROCEDURES:  1. Catheter placement into right peroneal artery from left femoral approach.  2. Aortogram and selective right lower extremity angiogram.  3. Percutaneous transluminal angioplasty of distal popliteal artery and tibioperoneal trunk with 4 mm diameter angioplasty balloon.  4. Percutaneous transluminal angioplasty of right peroneal artery with 2 and 2.5 mm diameter angioplasty balloons.  5. StarClose closure device left femoral artery.  SURGEON: Festus BarrenJason Perrie Ragin, MD  ANESTHESIA: Local with moderate conscious sedation.   ESTIMATED BLOOD LOSS: Minima.  INDICATION FOR PROCEDURE: This is an 81 year old African American male with nonhealing ulceration of the right lower extremity. He has had previous revascularization but his flow does not appear adequate for wound healing at current. He is brought back for further angiography an hopeful revascularization. Risks and benefits were discussed and informed consent was obtained.   DESCRIPTION OF PROCEDURE: The patient was brought to the vascular interventional radiology suite. Groins were shaved and prepped and a sterile surgical field was created. The left femoral head was localized with fluoroscopy and the left femoral artery was accessed without difficulty with a Seldinger needle. A J-wire and 5 French sheath were placed. Pigtail catheter was placed in the aorta at the L1 level and AP aortogram was performed. This showed no flow-limiting stenosis in the aortoiliac segments with patent renal arteries bilaterally. I then hooked the aortic bifurcation and  advanced to the right femoral head and selective right lower extremity angiogram was then performed. This showed diffusely calcified common femoral, superficial femoral, and profunda femoris artery, but these were patent without stenosis. The below-knee popliteal artery into the tibioperoneal trunk had a tapered stenosis in the 70 to 80% range. The peroneal was then the only runoff to the foot and it had two areas of stenosis that were high-grade in the midsegment in a very calcified vessel. The patient was systemically heparinized. A 6 French Ansel sheath was placed over a Terumo Advantage wire and a Kumpe catheter was then used. We exchanged out initially for an 0.018 wire and parked in the distal peroneal artery beyond the stenoses. The distal popliteal artery and tibioperoneal trunk lesion was treated with a 4 mm diameter angioplasty balloon with good angiographic completion result. I then turned my attention to the peroneal stenoses. I could not get any 0.018 balloons to track over the wire and cross these lesions. I inflated a 2.5 mm diameter balloon encompassing the proximal stenosis, but could not get beyond the distal stenosis. I then exchanged for an 0.014 wire. Conventional 0.014 wires would not track across the stenosis either. I was able to get in a coronary 2 mm balloon. Fortunately the stenosis was short segment and completion angiogram after balloon angioplasty with a 2 mm diameter angioplasty balloon showed improved flow with peroneal artery being continuous. At this point, I elected to terminate the procedure. The sheath was pulled back to the ipsilateral external iliac artery and oblique arteriogram was performed. StarClose closure device was deployed in the usual fashion with excellent hemostatic result. The patient tolerated the procedure well and was taken to the recovery room in stable condition.  ____________________________ Marlow BaarsJason S.  Wyn Quaker, MD jsd:slb D: 09/16/2011 11:54:30 ET      T: 09/16/2011 12:30:23 ET        JOB#: [id number] cc: Annice Needy, MD, <Dictator> Serita Sheller Maryellen Pile, MD Annice Needy MD ELECTRONICALLY SIGNED 09/16/2011 16:28

## 2014-06-26 NOTE — Consult Note (Signed)
Chief Complaint:   Subjective/Chief Complaint continues with some epigastric pain toward luq.   VITAL SIGNS/ANCILLARY NOTES: **Vital Signs.:   09-Feb-13 14:22   Vital Signs Type Routine   Temperature Temperature (F) 98.3   Celsius 36.8   Temperature Source oral   Pulse Pulse 84   Respirations Respirations 20   Systolic BP Systolic BP 532   Diastolic BP (mmHg) Diastolic BP (mmHg) 75   Mean BP 92   BP Source Dinamap   Pulse Ox % Pulse Ox % 95   Brief Assessment:   Cardiac Regular    Respiratory clear BS    Gastrointestinal details normal No rebound tenderness  mild distension, mild to moderate epigastric pain to palpation, bs positive/distant   Routine Chem:  07-Feb-13 16:13    Lipase > 3000  08-Feb-13 04:43    Lipase 2600  Hepatic:  08-Feb-13 04:43    Bilirubin, Total 0.7   Alkaline Phosphatase 169   SGPT (ALT) 198   SGOT (AST) 77   Bilirubin, Direct 0.1  Routine Hem:  09-Feb-13 06:28    WBC (CBC) 13.9   RBC (CBC) 3.80   Hemoglobin (CBC) 11.6   Hematocrit (CBC) 36.0   Platelet Count (CBC) 287   MCV 95   MCH 30.6   MCHC 32.3   RDW 12.9  Routine Chem:  09-Feb-13 06:28    Lipase 732   Glucose, Serum 213   BUN 11   Creatinine (comp) 0.97   Sodium, Serum 136   Potassium, Serum 4.0   Chloride, Serum 103   CO2, Serum 27   Calcium (Total), Serum 8.2  Hepatic:  09-Feb-13 06:28    Bilirubin, Total 0.5   Alkaline Phosphatase 126   SGPT (ALT) 108   SGOT (AST) 25   Total Protein, Serum 6.4   Albumin, Serum 2.7  Routine Chem:  09-Feb-13 06:28    Osmolality (calc) 278   eGFR (African American) >60   eGFR (Non-African American) >60   Anion Gap 6  Routine Hem:  09-Feb-13 06:28    Neutrophil % 78.9   Lymphocyte % 11.6   Monocyte % 7.8   Eosinophil % 1.3   Basophil % 0.4   Neutrophil # 11.0   Lymphocyte # 1.6   Monocyte # 1.1   Eosinophil # 0.2   Basophil # 0.1  Hepatic:  09-Feb-13 06:28    Bilirubin, Direct 0.1  Blood Glucose:  09-Feb-13 07:22     POCT Blood Glucose 125    11:13    POCT Blood Glucose 138    16:30    POCT Blood Glucose 128   Radiology Results: CT:    07-Feb-13 19:52, CT Abdomen and Pelvis Without Contrast   CT Abdomen and Pelvis Without Contrast    REASON FOR EXAM:      COMMENTS:       PROCEDURE: CT  - CT ABDOMEN AND PELVIS W0  - Apr 11 2011  7:52PM     RESULT:  Comparison: None    Technique: Multiple axial images from the lung bases to the symphysis   pubis were obtained without oral and without intravenous contrast.    Findings:  Mild opacities in the left lower lobe may represent atelectasis. Early   infection or aspiration would be difficult to exclude. Calcifications are   seen in the coronary arteries.    Lack of intravenous contrast limits evaluation of the solid abdominal   organs.  Grossly, the liver, spleen, and adrenals are unremarkable. The  gallbladder is relatively dense, consistent with the extensive gallstones   seen on the prior right upper quadrant ultrasound. No renal calculi or   hydronephrosis. There is suggestion of mild stranding about the pancreas,   although this evaluation is limited by patient motion. There is a trace   amount of fluid extending inferiorly along the left lateral conal fascia.    The smalland large bowel are normal in caliber. The appendix is normal.   There are extensive vascular calcifications.    There is circumferential bladder wall thickening. This may in part be   related to underdistention.    There is irregular lucency and sclerosis involving the right iliac wing,     right acetabulum, right inferior pubic ramus, and right inferior ramus.   This is nonspecific. Etiologies would include fibrous dysplasia as well   as metastatic disease.    IMPRESSION:    1. There is suggestion of stranding about the pancreas and a small amount   of fluid along the left lateral conal fascia. Correlate for acute   pancreatitis.  2. Hyperdensity in the  gallbladder is consistent with extensive   gallstones seen on the prior ultrasound.  3. Circumferential bladder wall thickening is nonspecific and may in part   be related to underdistention. This is nonspecific. Cystitis could have a   similar appearance. Correlate with urinalysis.  4. Mild opacities in the left lower lobe may representatelectasis. Early   infection or aspiration are not excluded.  5. Heterogeneous lucency and sclerosis involving the bones of the right   hemipelvis. Differential includes benign etiologies such as fibrous   dysplasia as well as other etiologies suchas metastatic disease.   Recommend correlation with outside prior radiographs of the pelvis. A   bone scan could be performed for further evaluation.      Addendum:  The findings in the pelvis would be a good appearance for Paget's   disease. Metastatic disease is felt unlikely.          Verified By: Gregor Hams, M.D., MD   Assessment/Plan:  Assessment/Plan:   Assessment 1) biliary pancreatitis, improving.  agree with [facility] when clinically feasible.  Will likely need IOC vs mrcp beforehand.    Plan as above.   Electronic Signatures: Loistine Simas (MD)  (Signed 09-Feb-13 18:02)  Authored: Chief Complaint, VITAL SIGNS/ANCILLARY NOTES, Brief Assessment, Lab Results, Radiology Results, Assessment/Plan   Last Updated: 09-Feb-13 18:02 by Loistine Simas (MD)

## 2015-08-10 IMAGING — US US RENAL KIDNEY
1 series · 14 of 25 positions shown · non-contrast
Comparison: 11/07/2013 CT scan

CLINICAL DATA: Evaluate for hydronephrosis

EXAM:
RENAL/URINARY TRACT ULTRASOUND COMPLETE

[Series 1: us renal kidney · 0.22mm/px · 14 of 36 slices shown]
[im 1/36]
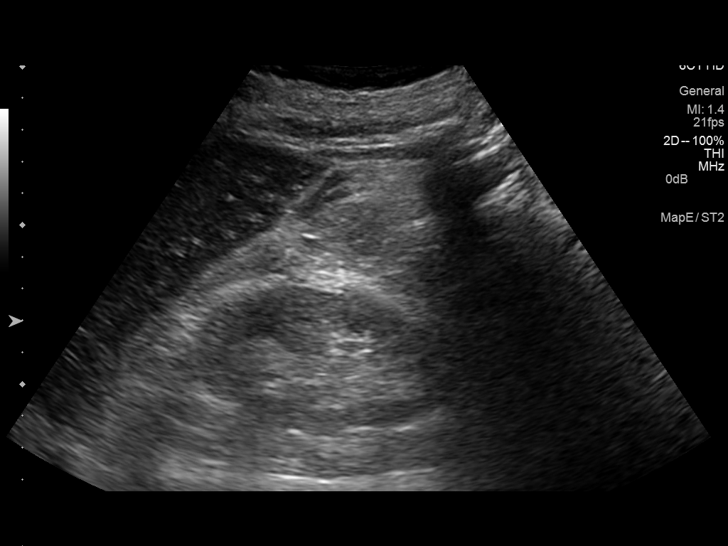
[im 3/36]
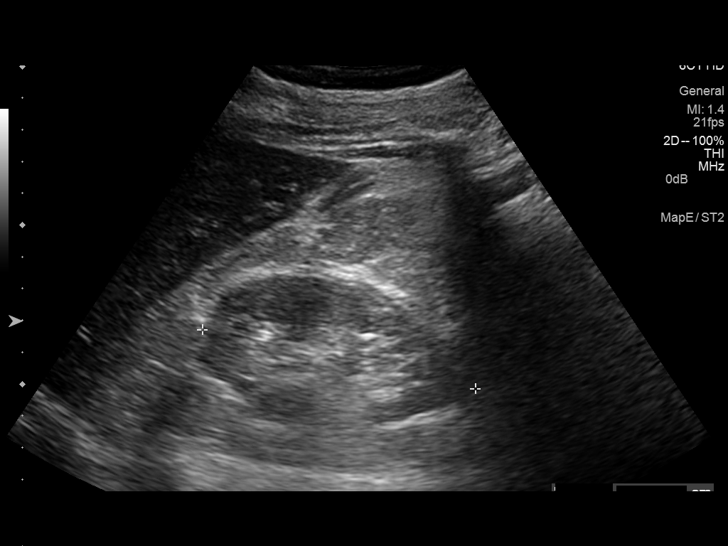
[im 6/36]
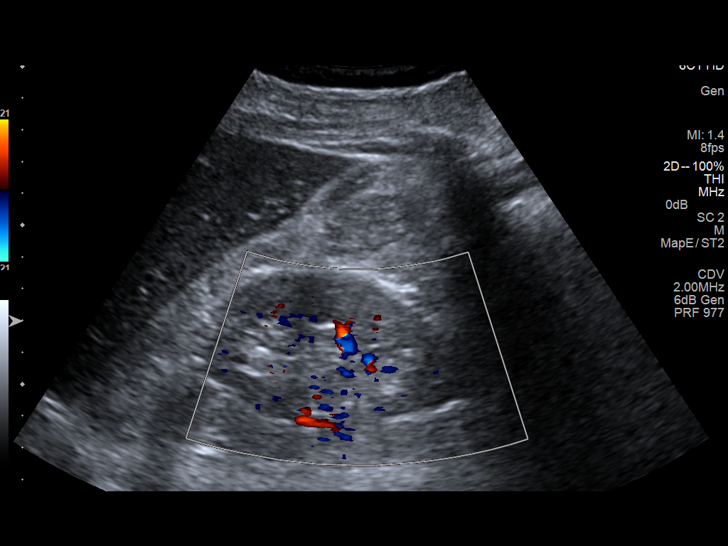
[im 9/36]
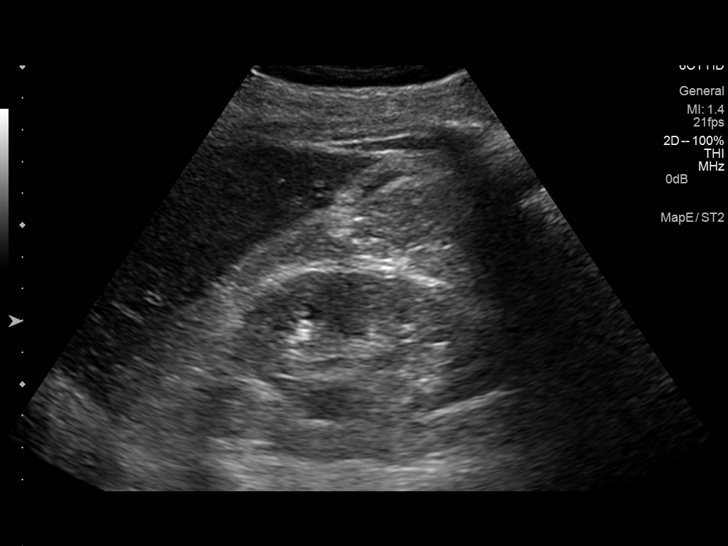
[im 12/36]
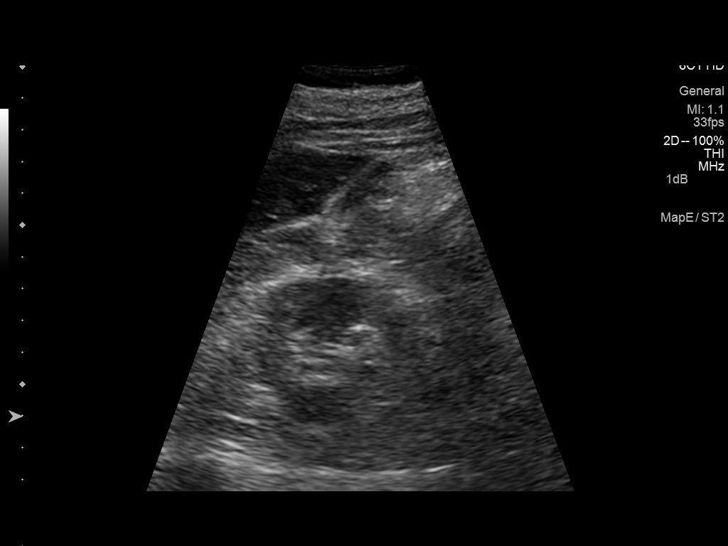
[im 14/36]
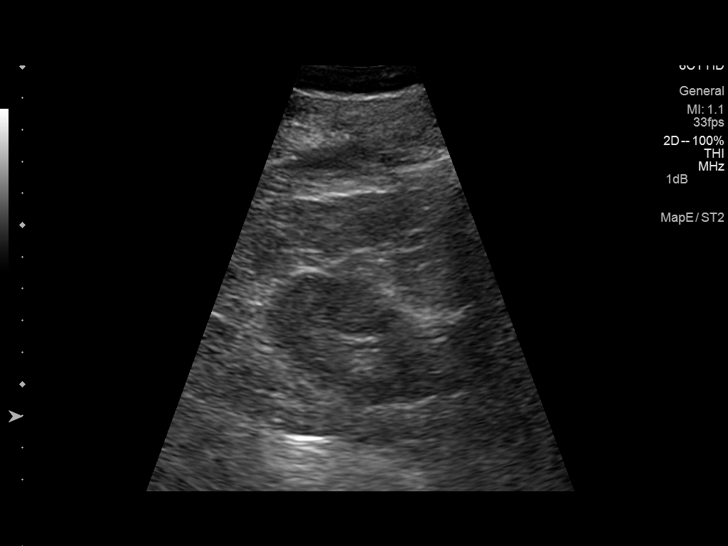
[im 17/36]
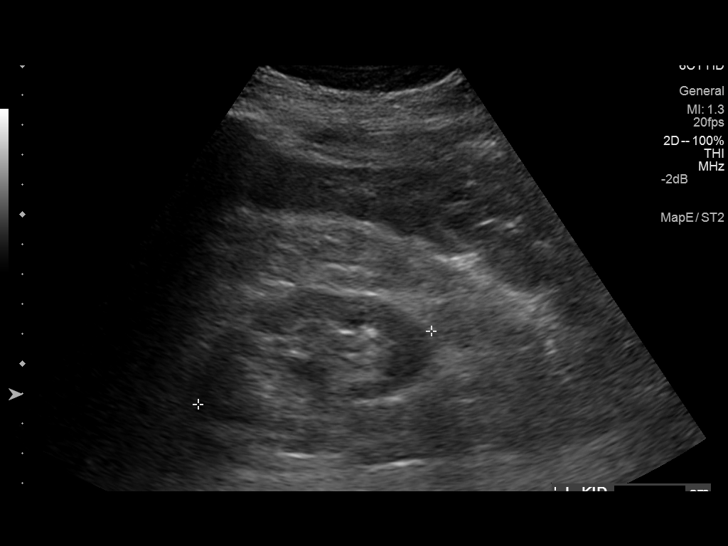
[im 19/36]
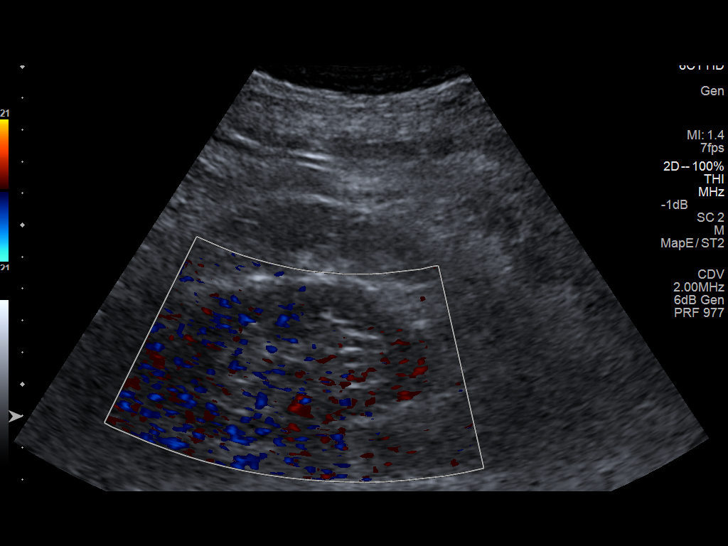
[im 22/36]
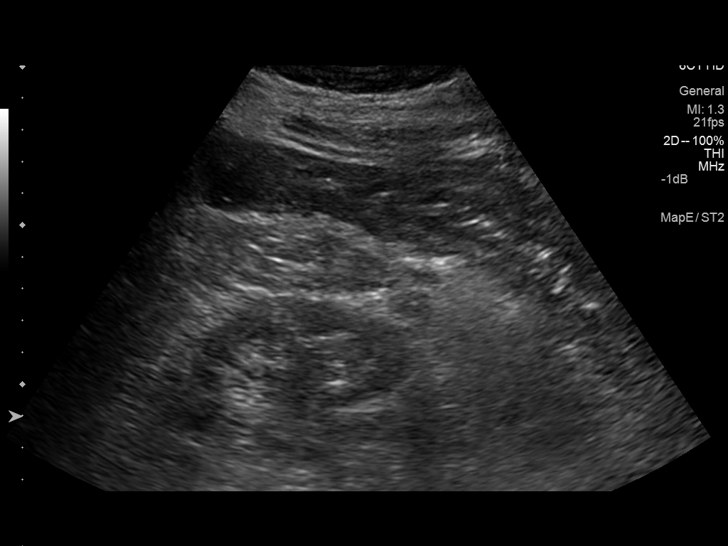
[im 24/36]
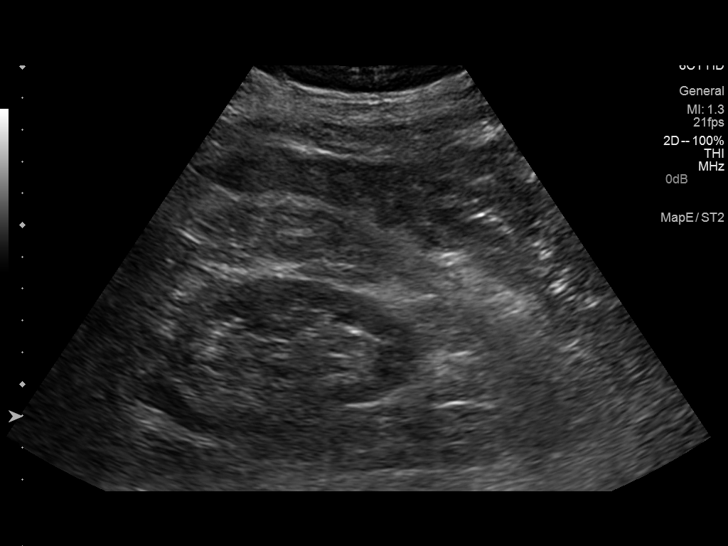
[im 27/36]
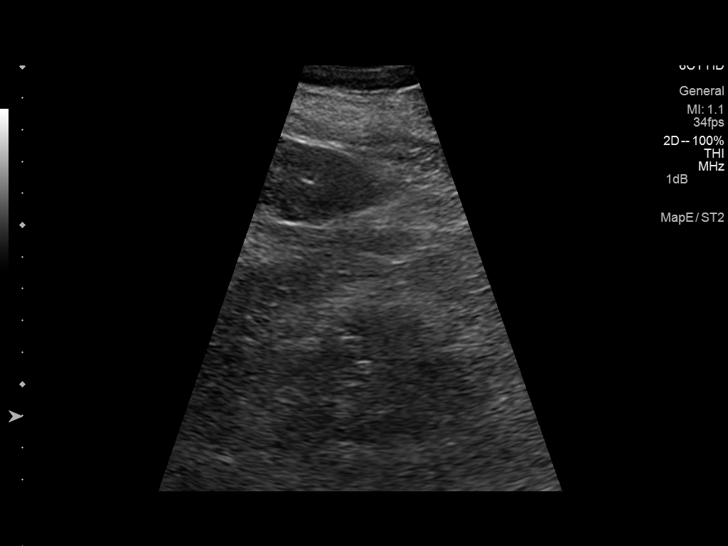
[im 30/36]
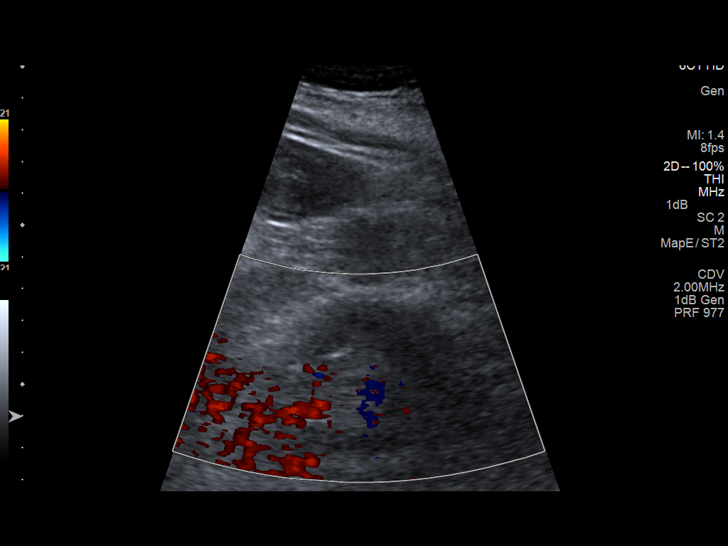
[im 33/36]
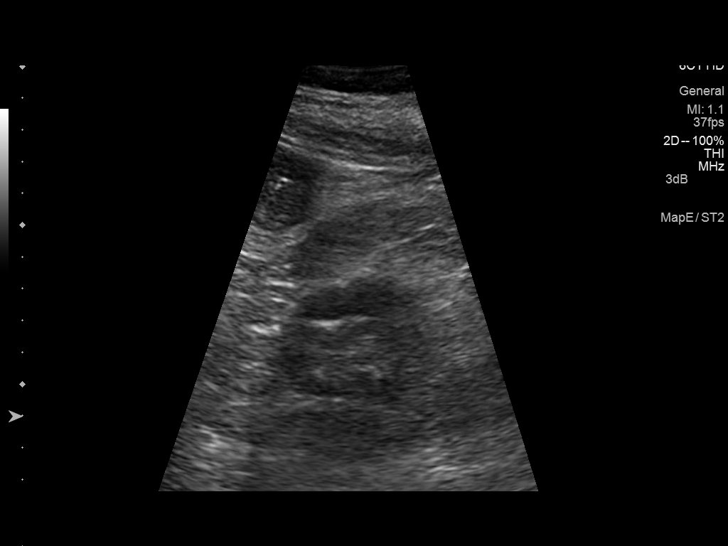
[im 36/36]
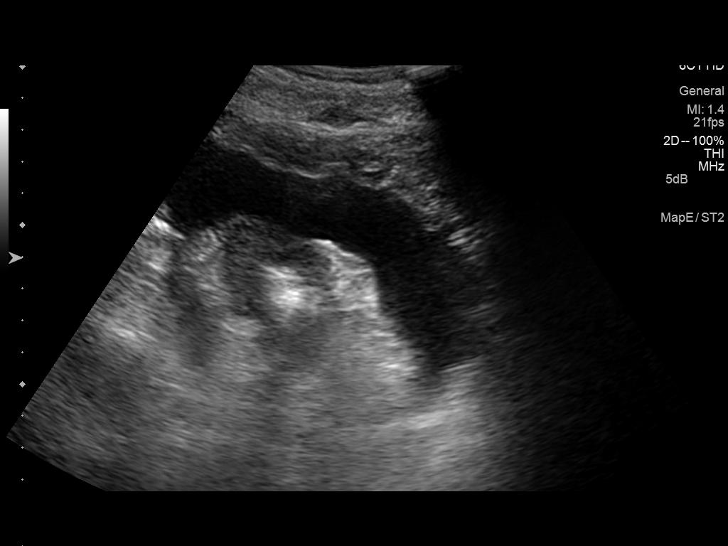

[14 of 25 positions shown; findings below may reference images not displayed]

FINDINGS: Right Kidney:

Length: 8.8 cm. Mildly increased echogenicity with no hydronephrosis

Left Kidney:

Length: 8.1 cm. Mildly increased echogenicity. Extremely slight,
equivocal collecting system fullness.

Bladder:

Not visualized. Likely decompressed as the patient has a Foley
catheter.

Incidentally detected are bilateral pleural effusions as well as a
small volume of ascites in the pelvis.
IMPRESSION: Evidence of medical renal disease, with questionable minimal left
collecting system fullness. Pleural effusions and a small volume of
ascites noted.

## 2015-08-11 IMAGING — CR DG CHEST 1V PORT
1 series · 1 of 1 positions shown · non-contrast
Comparison: Portable chest x-rays yesterday dating back to
11/07/2013. Portable chest x-ray 07/30/2012.

CLINICAL DATA: Follow-up CHF and effusions.

EXAM:
PORTABLE CHEST - 1 VIEW

[x chest ap]
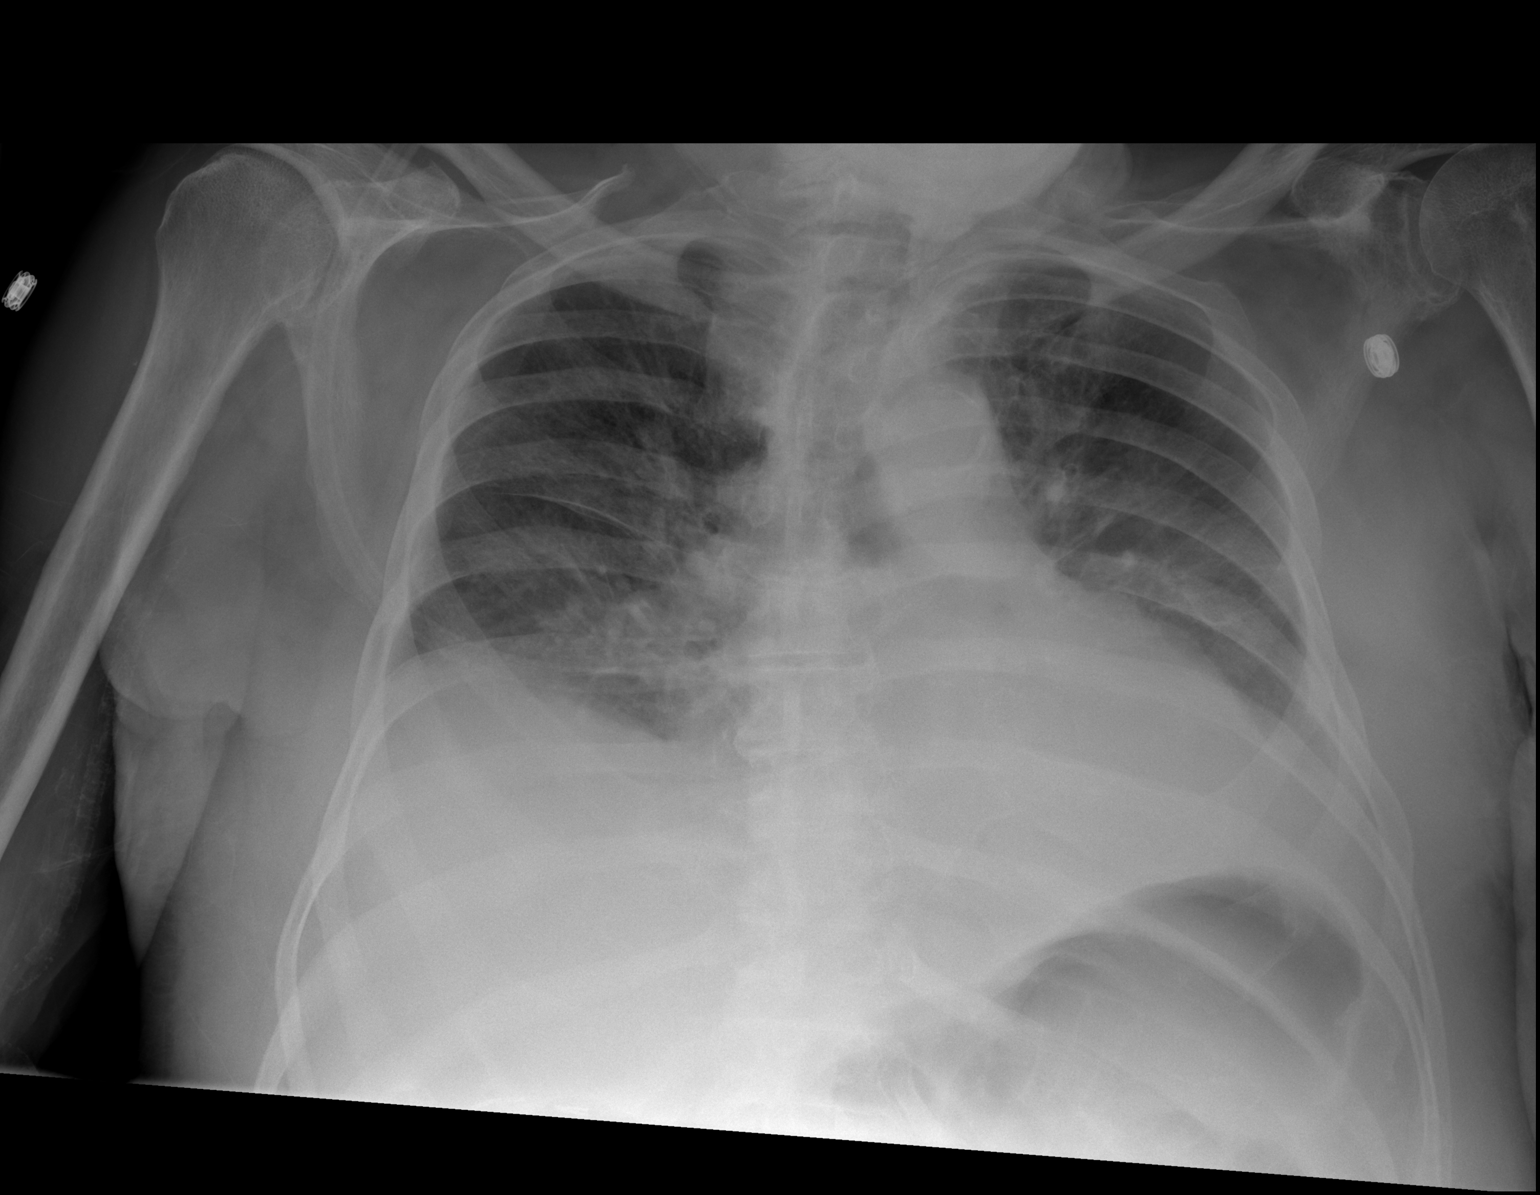

[1 of 1 positions shown; findings below may reference images not displayed]

FINDINGS: Interval improvement in interstitial pulmonary edema since
yesterday, though mild interstitial edema persists. Stable bilateral
pleural effusions, left greater than right, and consolidation in the
lower lobes. Stable cardiac enlargement. No new pulmonary
parenchymal abnormalities.
IMPRESSION: Interval slight improvement in CHF since yesterday, though mild
interstitial edema persists. Stable bilateral pleural effusions,
left greater than right with associated passive atelectasis in the
lower lobes. No new abnormalities.

## 2015-08-11 IMAGING — CR DG ABDOMEN 1V
1 series · 2 of 2 positions shown · non-contrast
Comparison: Abdomen film of 11/07/2013 and CT abdomen pelvis of the
same day

CLINICAL DATA: Small bowel obstruction, followup

EXAM:
ABDOMEN - 1 VIEW

[Series 4: x abdomen supine · 0.14mm/px · 2 of 2 slices shown]
[im 1/2]
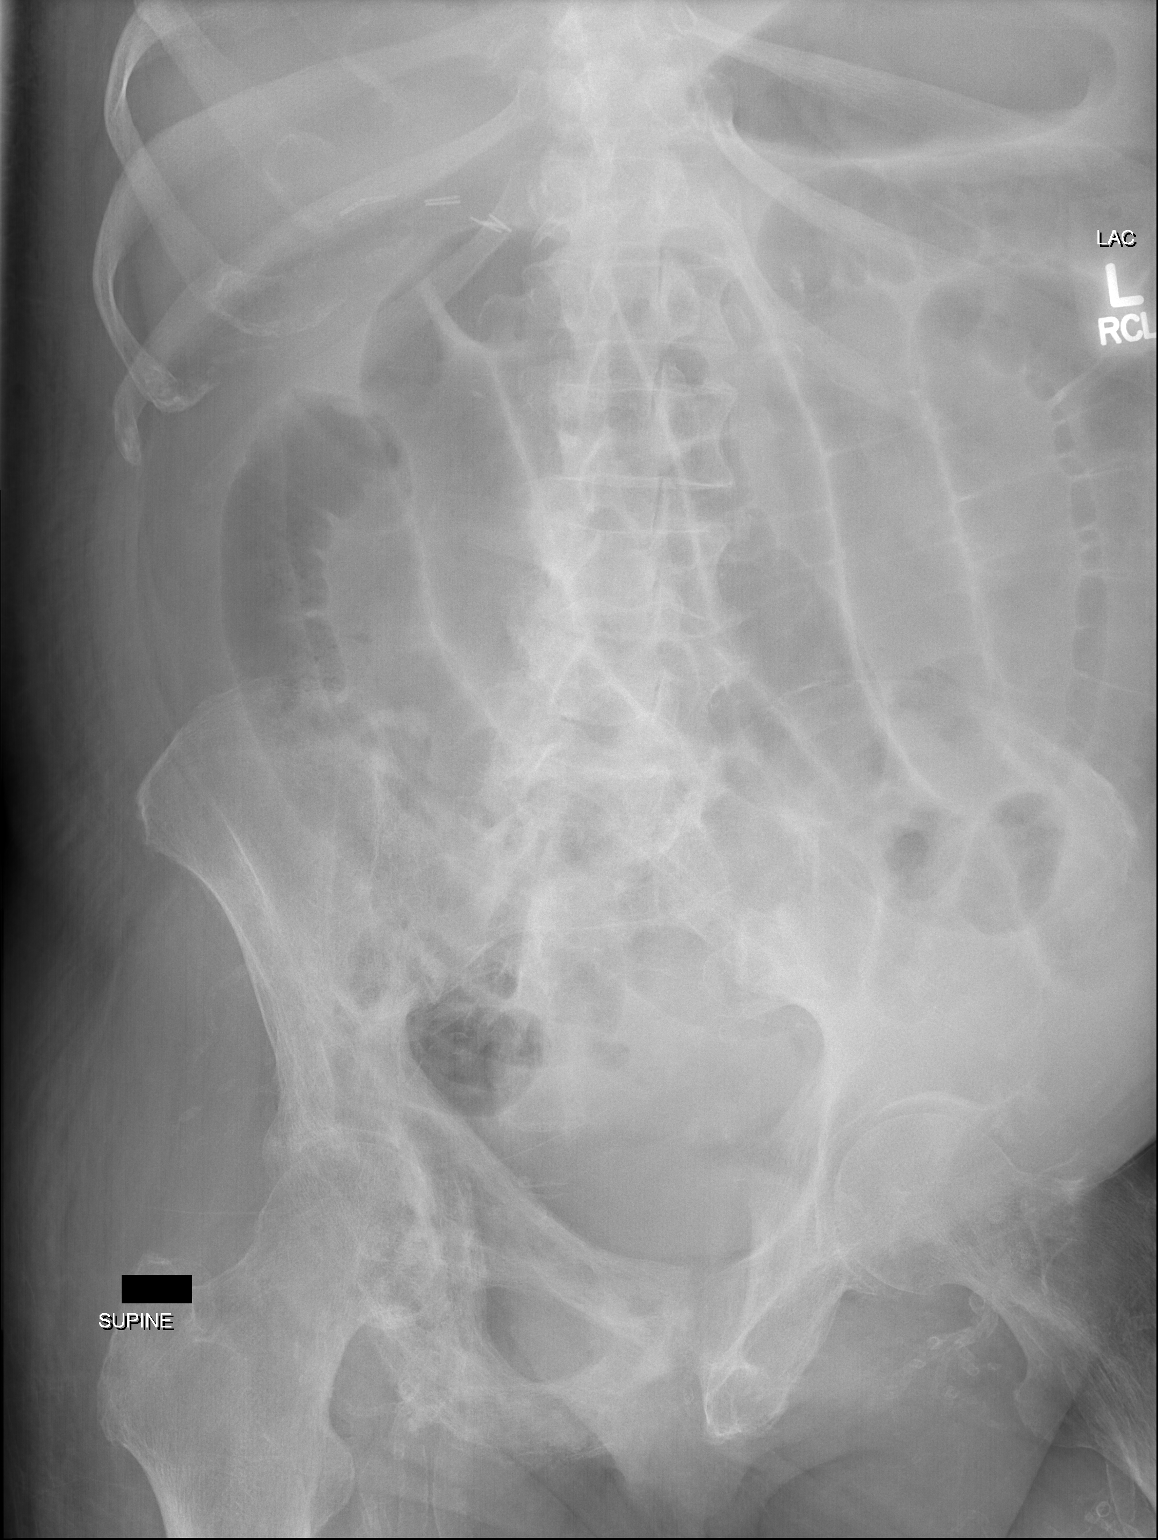
[im 2/2]
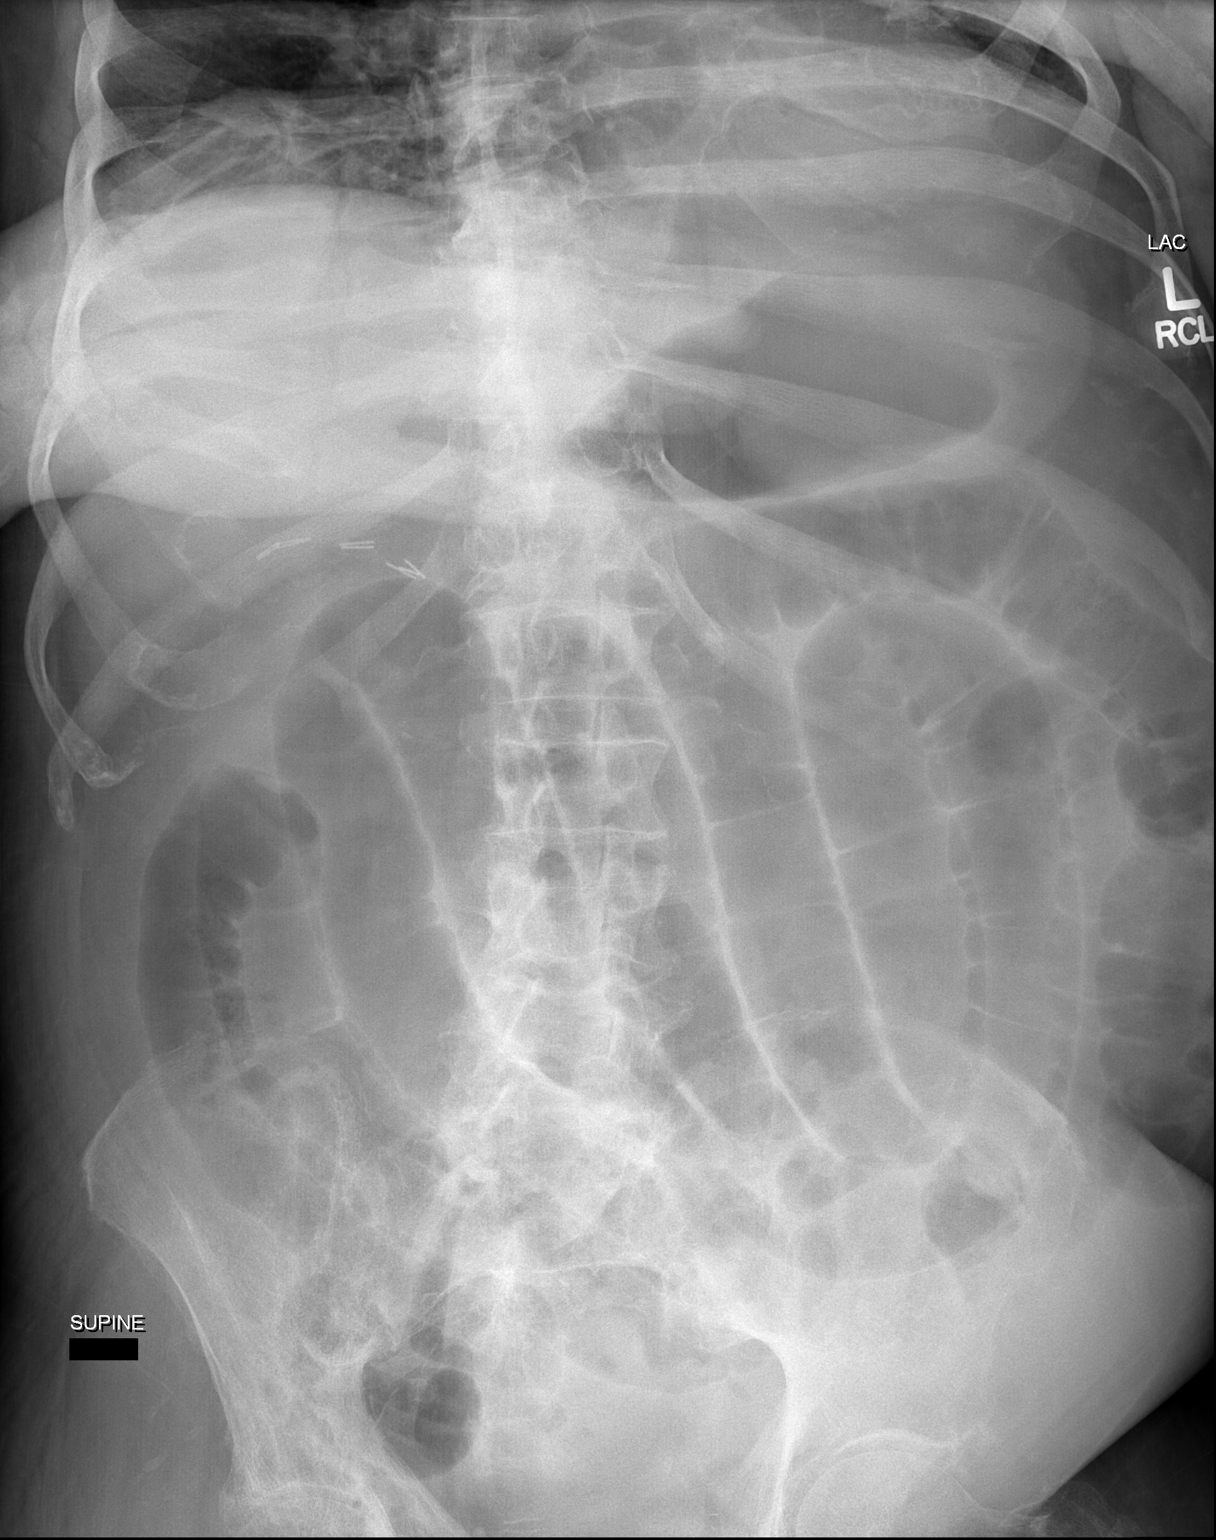

[2 of 2 positions shown; findings below may reference images not displayed]

FINDINGS: There has been some increase in gaseous distention of small bowel.
In view of the patient's history of diffuse colitis this may
represent ileus with partial small bowel obstruction not able to be
excluded on the current films. Degenerative changes are noted in the
right hip. The trabecular pattern of the right hemipelvis suggests
Paget's disease.
IMPRESSION: 1. More gas distend distention of small bowel may indicate ileus
although partial small bowel obstruction cannot be excluded.
2. Probable Paget's involvement of the right hemipelvis spot

## 2015-08-12 IMAGING — CR DG ABDOMEN 1V
1 series · 1 of 1 positions shown · non-contrast
Comparison: 11/11/2013.

CLINICAL DATA: Followup ileus versus small-bowel obstruction.

EXAM:
ABDOMEN - 1 VIEW

[x abdomen supine]
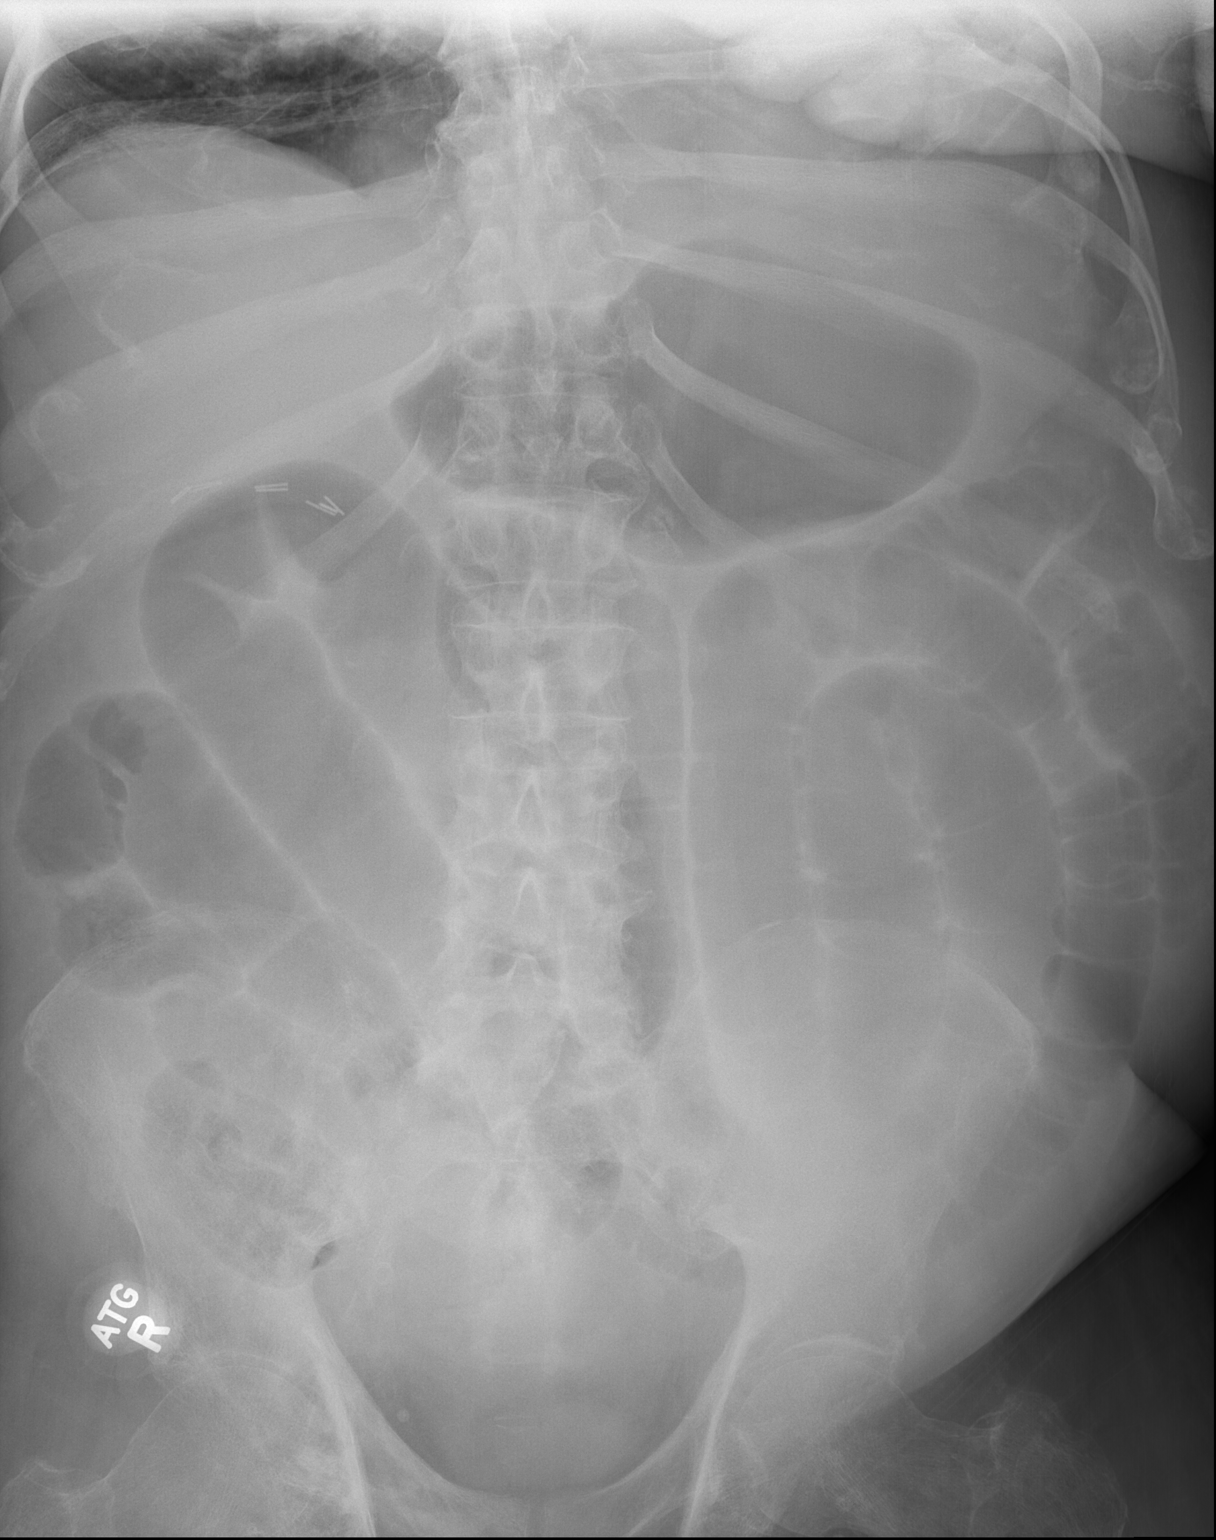

[1 of 1 positions shown; findings below may reference images not displayed]

FINDINGS: Multiple loops of mildly dilated small bowel are similar to the
prior exam. No colonic or rectal air is evident.

Right upper quadrant clips from a prior cholecystectomy. Soft
tissues are otherwise unremarkable.
IMPRESSION: No significant change from the previous day's study. Persistent mild
small bowel dilation. Findings may reflect a mild adynamic ileus or
low grade partial obstruction.
